# Patient Record
Sex: Female | Born: 1960
Health system: Southern US, Community
[De-identification: ages and names within clinical notes are randomized; demographics above are authoritative.]

## PROBLEM LIST (undated history)

## (undated) DIAGNOSIS — K219 Gastro-esophageal reflux disease without esophagitis: Secondary | ICD-10-CM

## (undated) DIAGNOSIS — M8580 Other specified disorders of bone density and structure, unspecified site: Secondary | ICD-10-CM

## (undated) DIAGNOSIS — N63 Unspecified lump in unspecified breast: Secondary | ICD-10-CM

## (undated) DIAGNOSIS — E785 Hyperlipidemia, unspecified: Secondary | ICD-10-CM

## (undated) DIAGNOSIS — S52502A Unspecified fracture of the lower end of left radius, initial encounter for closed fracture: Secondary | ICD-10-CM

## (undated) DIAGNOSIS — E559 Vitamin D deficiency, unspecified: Secondary | ICD-10-CM

## (undated) HISTORY — DX: Unspecified lump in unspecified breast: N63.0

## (undated) HISTORY — PX: OOPHORECTOMY: SHX86

## (undated) HISTORY — DX: Vitamin D deficiency, unspecified: E55.9

## (undated) HISTORY — PX: BREAST BIOPSY: SHX20

## (undated) HISTORY — DX: Other specified disorders of bone density and structure, unspecified site: M85.80

---

## 1998-04-11 HISTORY — PX: ABDOMINAL HYSTERECTOMY: SHX81

## 2004-08-24 ENCOUNTER — Ambulatory Visit: Payer: Self-pay | Admitting: Family Medicine

## 2007-04-12 HISTORY — PX: TONSILLECTOMY: SUR1361

## 2009-04-11 DIAGNOSIS — N63 Unspecified lump in unspecified breast: Secondary | ICD-10-CM

## 2009-04-11 HISTORY — DX: Unspecified lump in unspecified breast: N63.0

## 2009-04-11 HISTORY — PX: BREAST EXCISIONAL BIOPSY: SUR124

## 2009-06-11 ENCOUNTER — Ambulatory Visit: Payer: Self-pay | Admitting: Family Medicine

## 2009-11-23 ENCOUNTER — Ambulatory Visit: Payer: Self-pay | Admitting: Internal Medicine

## 2011-11-10 ENCOUNTER — Ambulatory Visit: Payer: Self-pay | Admitting: Obstetrics and Gynecology

## 2011-11-10 LAB — CBC WITH DIFFERENTIAL/PLATELET
Basophil %: 0.4 %
Eosinophil %: 1.5 %
HCT: 40.3 % (ref 35.0–47.0)
HGB: 13.9 g/dL (ref 12.0–16.0)
Lymphocyte #: 2.1 10*3/uL (ref 1.0–3.6)
Lymphocyte %: 33.1 %
MCV: 89 fL (ref 80–100)
Monocyte %: 5.5 %
Neutrophil #: 3.9 10*3/uL (ref 1.4–6.5)
Neutrophil %: 59.5 %
Platelet: 291 10*3/uL (ref 150–440)
RBC: 4.54 10*6/uL (ref 3.80–5.20)
RDW: 11.9 % (ref 11.5–14.5)
WBC: 6.5 10*3/uL (ref 3.6–11.0)

## 2011-11-10 LAB — COMPREHENSIVE METABOLIC PANEL
Bilirubin,Total: 0.4 mg/dL (ref 0.2–1.0)
Calcium, Total: 9.3 mg/dL (ref 8.5–10.1)
Chloride: 101 mmol/L (ref 98–107)
Co2: 30 mmol/L (ref 21–32)
EGFR (African American): >60
EGFR (Non-African Amer.): >60
SGOT(AST): 24 U/L (ref 15–37)
SGPT (ALT): 39 U/L
Total Protein: 8.4 g/dL — ABNORMAL HIGH (ref 6.4–8.2)

## 2011-11-10 LAB — TSH: Thyroid Stimulating Horm: 1.26 u[IU]/mL

## 2011-11-10 LAB — LIPID PANEL
Ldl Cholesterol, Calc: 158 mg/dL — ABNORMAL HIGH (ref 0–100)
VLDL Cholesterol, Calc: 43 mg/dL — ABNORMAL HIGH (ref 5–40)

## 2011-11-17 ENCOUNTER — Ambulatory Visit: Payer: Self-pay | Admitting: General Surgery

## 2011-11-28 ENCOUNTER — Ambulatory Visit: Payer: Self-pay | Admitting: General Surgery

## 2012-05-20 ENCOUNTER — Encounter: Payer: Self-pay | Admitting: *Deleted

## 2012-05-20 DIAGNOSIS — N63 Unspecified lump in unspecified breast: Secondary | ICD-10-CM | POA: Insufficient documentation

## 2012-07-10 ENCOUNTER — Ambulatory Visit: Payer: Self-pay | Admitting: General Surgery

## 2012-08-15 ENCOUNTER — Encounter: Payer: Self-pay | Admitting: *Deleted

## 2013-04-22 ENCOUNTER — Ambulatory Visit: Payer: Self-pay

## 2013-04-22 LAB — URINALYSIS, COMPLETE
Glucose,UR: NEGATIVE mg/dL (ref 0–75)
Ketone: NEGATIVE
Nitrite: NEGATIVE
Ph: 6 (ref 4.5–8.0)
Specific Gravity: 1.025 (ref 1.003–1.030)

## 2013-04-24 LAB — URINE CULTURE

## 2014-02-10 ENCOUNTER — Encounter: Payer: Self-pay | Admitting: *Deleted

## 2014-04-02 ENCOUNTER — Ambulatory Visit: Payer: Self-pay | Admitting: Physician Assistant

## 2014-04-02 LAB — URINALYSIS, COMPLETE
Bilirubin,UR: NEGATIVE
Glucose,UR: NEGATIVE
KETONE: NEGATIVE
Nitrite: NEGATIVE
Ph: 5 (ref 5.0–8.0)
Protein: NEGATIVE
Specific Gravity: 1.01 (ref 1.000–1.030)

## 2014-04-04 LAB — URINE CULTURE

## 2015-01-12 LAB — HM PAP SMEAR: HM PAP: NEGATIVE

## 2015-01-23 ENCOUNTER — Other Ambulatory Visit: Payer: Self-pay | Admitting: Obstetrics and Gynecology

## 2015-01-23 ENCOUNTER — Ambulatory Visit
Admission: EM | Admit: 2015-01-23 | Discharge: 2015-01-23 | Disposition: A | Discharge status: Home / Self Care | Payer: 59 | Attending: Internal Medicine | Admitting: Internal Medicine

## 2015-01-23 ENCOUNTER — Encounter: Payer: Self-pay | Admitting: Registered Nurse

## 2015-01-23 DIAGNOSIS — Z13820 Encounter for screening for osteoporosis: Secondary | ICD-10-CM

## 2015-01-23 DIAGNOSIS — L255 Unspecified contact dermatitis due to plants, except food: Secondary | ICD-10-CM

## 2015-01-23 DIAGNOSIS — Z1231 Encounter for screening mammogram for malignant neoplasm of breast: Secondary | ICD-10-CM

## 2015-01-23 MED ORDER — HYDROCORTISONE 2.5 % EX CREA: TOPICAL_CREAM | Freq: Two times a day (BID) | CUTANEOUS | Status: DC

## 2015-01-23 NOTE — ED Notes (Signed)
Pt states "I have rash on my lower legs, I have done Lotrimin and it has not worked. I feel like it's fungus."

## 2015-01-23 NOTE — Discharge Instructions (Signed)
Contact Dermatitis Dermatitis is redness, soreness, and swelling (inflammation) of the skin. Contact dermatitis is a reaction to certain substances that touch the skin. There are two types of contact dermatitis:   Irritant contact dermatitis. This type is caused by something that irritates your skin, such as dry hands from washing them too much. This type does not require previous exposure to the substance for a reaction to occur. This type is more common.  Allergic contact dermatitis. This type is caused by a substance that you are allergic to, such as a nickel allergy or poison ivy. This type only occurs if you have been exposed to the substance (allergen) before. Upon a repeat exposure, your body reacts to the substance. This type is less common. CAUSES  Many different substances can cause contact dermatitis. Irritant contact dermatitis is most commonly caused by exposure to:   Makeup.   Soaps.   Detergents.   Bleaches.   Acids.   Metal salts, such as nickel.  Allergic contact dermatitis is most commonly caused by exposure to:   Poisonous plants.   Chemicals.   Jewelry.   Latex.   Medicines.   Preservatives in products, such as clothing.  RISK FACTORS This condition is more likely to develop in:   People who have jobs that expose them to irritants or allergens.  People who have certain medical conditions, such as asthma or eczema.  SYMPTOMS  Symptoms of this condition may occur anywhere on your body where the irritant has touched you or is touched by you. Symptoms include:  Dryness or flaking.   Redness.   Cracks.   Itching.   Pain or a burning feeling.   Blisters.  Drainage of small amounts of blood or clear fluid from skin cracks. With allergic contact dermatitis, there may also be swelling in areas such as the eyelids, mouth, or genitals.  DIAGNOSIS  This condition is diagnosed with a medical history and physical exam. A patch skin test  may be performed to help determine the cause. If the condition is related to your job, you may need to see an occupational medicine specialist. TREATMENT Treatment for this condition includes figuring out what caused the reaction and protecting your skin from further contact. Treatment may also include:   Steroid creams or ointments. Oral steroid medicines may be needed in more severe cases.  Antibiotics or antibacterial ointments, if a skin infection is present.  Antihistamine lotion or an antihistamine taken by mouth to ease itching.  A bandage (dressing). HOME CARE INSTRUCTIONS Skin Care  Moisturize your skin as needed.   Apply cool compresses to the affected areas.  Try taking a bath with:  Epsom salts. Follow the instructions on the packaging. You can get these at your local pharmacy or grocery store.  Baking soda. Pour a small amount into the bath as directed by your health care provider.  Colloidal oatmeal. Follow the instructions on the packaging. You can get this at your local pharmacy or grocery store.  Try applying baking soda paste to your skin. Stir water into baking soda until it reaches a paste-like consistency.  Do not scratch your skin.  Bathe less frequently, such as every other day.  Bathe in lukewarm water. Avoid using hot water. Medicines  Take or apply over-the-counter and prescription medicines only as told by your health care provider.   If you were prescribed an antibiotic medicine, take or apply your antibiotic as told by your health care provider. Do not stop using the   antibiotic even if your condition starts to improve. General Instructions  Keep all follow-up visits as told by your health care provider. This is important.  Avoid the substance that caused your reaction. If you do not know what caused it, keep a journal to try to track what caused it. Write down:  What you eat.  What cosmetic products you use.  What you drink.  What  you wear in the affected area. This includes jewelry.  If you were given a dressing, take care of it as told by your health care provider. This includes when to change and remove it. SEEK MEDICAL CARE IF:   Your condition does not improve with treatment.  Your condition gets worse.  You have signs of infection such as swelling, tenderness, redness, soreness, or warmth in the affected area.  You have a fever.  You have new symptoms. SEEK IMMEDIATE MEDICAL CARE IF:   You have a severe headache, neck pain, or neck stiffness.  You vomit.  You feel very sleepy.  You notice red streaks coming from the affected area.  Your bone or joint underneath the affected area becomes painful after the skin has healed.  The affected area turns darker.  You have difficulty breathing.   This information is not intended to replace advice given to you by your health care provider. Make sure you discuss any questions you have with your health care provider.   Document Released: 03/25/2000 Document Revised: 12/17/2014 Document Reviewed: 08/13/2014 Elsevier Interactive Patient Education 2016 [employer]  

## 2015-01-23 NOTE — ED Provider Notes (Signed)
CSN: [id number]     Arrival date & time 01/23/15  1043 History   First MD Initiated Contact with Patient 01/23/15 1114     Chief Complaint  Patient presents with  . Rash   (Consider location/radiation/quality/duration/timing/severity/associated sxs/prior Treatment) HPI Comments: [name] works [employer] was weeding garden in shorts  1 month ago developed rash bilateral lower legs.  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  Spreading here for evaluation.  Has had poison ivy in the past this wasn't the same.  Pets at home  Patient is a 54 y.o. female presenting with rash. The history is provided by the patient.  Rash Location:  Leg Leg rash location:  L lower leg and R lower leg Quality: dryness, itchiness, peeling, redness and scaling   Quality: not blistering, not bruising, not burning, not draining, not painful, not swelling and not weeping   Severity:  Mild Onset quality:  Sudden Duration:  1 month Timing:  Constant Progression:  Spreading Chronicity:  New Context: animal contact, medications and plant contact   Context: not chemical exposure, not diapers, not eggs, not exposure to similar rash, not food, not hot tub use, not insect bite/sting, not new detergent/soap, not nuts, not pollen, not pregnancy, not sick contacts and not sun exposure   Relieved by:  Nothing Worsened by:  Nothing tried Ineffective treatments:  Anti-fungal cream Associated symptoms: no abdominal pain, no diarrhea, no fatigue, no fever, no headaches, no hoarse voice, no induration, no joint pain, no myalgias, no nausea, no periorbital edema, no shortness of breath, no sore throat, no throat swelling, no tongue swelling, no URI, not vomiting and not wheezing     Past Medical History  Diagnosis Date  . Lump or mass in breast 2011    LEFT   Past Surgical History  Procedure  Laterality Date  . Tonsillectomy  2009  . Abdominal hysterectomy  2000  . Cesarean section  [id number]  . Breast biopsy Left 2011   History reviewed. No pertinent family history. Social History  Substance Use Topics  . Smoking status: Never Smoker   . Smokeless tobacco: None  . Alcohol Use: No   OB History    Gravida Para Term Preterm AB TAB SAB Ectopic Multiple Living   2 2        2       Obstetric Comments   FIRST PREGNANCY 29 FIRST MENSTRUAL 17     Review of Systems  Constitutional: Negative for fever, chills, diaphoresis, activity change, appetite change, fatigue and unexpected weight change.  HENT: Negative for congestion, dental problem, drooling, ear discharge, ear pain, facial swelling, hoarse voice, mouth sores, sore throat, trouble swallowing and voice change.   Eyes: Negative for photophobia, pain, discharge, redness, itching and visual disturbance.  Respiratory: Negative for cough, choking, chest tightness, shortness of breath, wheezing and stridor.   Cardiovascular: Negative for chest pain, palpitations and leg swelling.  Gastrointestinal: Negative for nausea, vomiting, abdominal pain, diarrhea, constipation, blood in stool, abdominal distention and rectal pain.  Endocrine: Negative for cold intolerance and heat intolerance.  Genitourinary: Negative for dysuria.  Musculoskeletal: Negative for myalgias, back pain, joint swelling, arthralgias, gait problem, neck pain and neck stiffness.  Skin: Positive for rash. Negative for color change, pallor and wound.  Allergic/Immunologic: Positive for environmental allergies. Negative for food allergies.  Neurological: Negative for dizziness, tremors, seizures, syncope, facial asymmetry,  speech difficulty, weakness, light-headedness, numbness and headaches.  Hematological: Negative for adenopathy. Does not bruise/bleed easily.  Psychiatric/Behavioral: Negative for behavioral problems, confusion, sleep disturbance and agitation.     Allergies  Morphine and related  Home Medications   Prior to Admission medications   Medication Sig Start Date End Date Taking? Authorizing Provider  hydrocortisone 2.5 % cream Apply topically 2 (two) times daily. 01/23/15   Barbaraann Barthel, NP   Meds Ordered and Administered this Visit  Medications - No data to display  BP 115/70 mmHg  Pulse 77  Temp(Src) 97.8 F (36.6 C) (Tympanic)  Resp 16  Ht  (1.626 m)  Wt 154 lb (69.854 kg)  BMI 26.42 kg/m2  SpO2 100% No data found.   Physical Exam  Constitutional: She is oriented to person, place, and time. Vital signs are normal. She appears well-developed and well-nourished. No distress.  HENT:  Head: Normocephalic and atraumatic.  Right Ear: External ear normal.  Left Ear: External ear normal.  Mouth/Throat: Oropharynx is clear and moist.  Eyes: Conjunctivae, EOM and lids are normal. Pupils are equal, round, and reactive to light. Right eye exhibits no discharge. Left eye exhibits no discharge. No scleral icterus.  Neck: Trachea normal and normal range of motion. Neck supple. No tracheal deviation present.  Cardiovascular: Normal rate, regular rhythm and intact distal pulses.   Pulmonary/Chest: Effort normal and breath sounds normal. No stridor. No respiratory distress. She has no wheezes. She has no rales. She exhibits no tenderness.  Abdominal: Soft. She exhibits no distension.  Musculoskeletal: Normal range of motion. She exhibits no edema or tenderness.       Right knee: Normal.       Left knee: Normal.       Right ankle: Normal.       Left ankle: Normal.       Right lower leg: Normal.       Left lower leg: Normal.  Neurological: She is alert and oriented to person, place, and time. She displays no atrophy and no tremor. No sensory deficit. She exhibits normal muscle tone. She displays no seizure activity. Coordination and gait normal. GCS eye subscore is 4. GCS verbal subscore is 5. GCS motor subscore is 6.   Skin: Skin is warm, dry and intact. Rash noted. No abrasion, no bruising, no burn, no ecchymosis, no laceration, no lesion, no petechiae and no purpura noted. Rash is macular. Rash is not papular, not maculopapular, not nodular, not pustular, not vesicular and not urticarial. She is not diaphoretic. No cyanosis or erythema. No pallor. Nails show no clubbing.     Nummular scaley erythematous lesions bilateral shins dry fine to medium scale entire lesion not TTP  Psychiatric: She has a normal mood and affect. Her speech is normal and behavior is normal. Judgment and thought content normal. Cognition and memory are normal.  Nursing note and vitals reviewed.   ED Course  Procedures (including critical care time)  Labs Review Labs Reviewed - No data to display  Imaging Review No results found.     MDM   1. Contact dermatitis due to plants, except food    Had been weeding in garden suspect contact dermatitis as rash started after plant contact but no known poison ivy/oak contact.  Trial of lotrimin dried up lesions but spreading anterior shins.  Medication as directed.  Topical hydrocortisone 2.5% cream applied to affected areas then cover with emollient.  Will hold on po prednisone at this time.  If no improvement by Monday afternoon patient to follow up with me 26 Jan 2015.  Avoid itching.   Symptomatic therapy suggested.  Warm to cool water soaks and/or oatmeal baths.  Call or return to clinic as needed if these symptoms worsen or fail to improve as anticipated.  Exitcare handout on contact dermatitis given to patient.  Patient verbalized agreement and understanding of treatment plan.   P2:  Avoidance and hand washing.     Barbaraann Barthel, NP 01/23/15 1704

## 2015-01-29 ENCOUNTER — Other Ambulatory Visit: Payer: Self-pay | Admitting: Obstetrics and Gynecology

## 2015-01-29 ENCOUNTER — Other Ambulatory Visit: Payer: Self-pay | Admitting: *Deleted

## 2015-01-29 ENCOUNTER — Inpatient Hospital Stay
Admission: RE | Admit: 2015-01-29 | Discharge: 2015-01-29 | Disposition: A | Discharge status: Home / Self Care | Payer: Self-pay | Source: Ambulatory Visit | Attending: *Deleted | Admitting: *Deleted

## 2015-01-29 DIAGNOSIS — Z9289 Personal history of other medical treatment: Secondary | ICD-10-CM

## 2015-01-29 DIAGNOSIS — R928 Other abnormal and inconclusive findings on diagnostic imaging of breast: Secondary | ICD-10-CM

## 2015-02-16 ENCOUNTER — Ambulatory Visit
Admission: RE | Admit: 2015-02-16 | Discharge: 2015-02-16 | Disposition: A | Discharge status: Home / Self Care | Payer: 59 | Source: Ambulatory Visit | Attending: Obstetrics and Gynecology | Admitting: Obstetrics and Gynecology

## 2015-02-16 DIAGNOSIS — R928 Other abnormal and inconclusive findings on diagnostic imaging of breast: Secondary | ICD-10-CM | POA: Diagnosis present

## 2015-02-16 DIAGNOSIS — R921 Mammographic calcification found on diagnostic imaging of breast: Secondary | ICD-10-CM | POA: Diagnosis not present

## 2015-02-16 DIAGNOSIS — M8588 Other specified disorders of bone density and structure, other site: Secondary | ICD-10-CM | POA: Insufficient documentation

## 2015-02-16 DIAGNOSIS — M85852 Other specified disorders of bone density and structure, left thigh: Secondary | ICD-10-CM | POA: Diagnosis not present

## 2015-02-16 DIAGNOSIS — Z13820 Encounter for screening for osteoporosis: Secondary | ICD-10-CM | POA: Insufficient documentation

## 2015-07-08 ENCOUNTER — Other Ambulatory Visit: Payer: Self-pay | Admitting: Obstetrics and Gynecology

## 2015-07-08 DIAGNOSIS — N6001 Solitary cyst of right breast: Secondary | ICD-10-CM

## 2015-08-19 ENCOUNTER — Ambulatory Visit
Admission: RE | Admit: 2015-08-19 | Discharge: 2015-08-19 | Disposition: A | Discharge status: Home / Self Care | Payer: 59 | Source: Ambulatory Visit | Attending: Obstetrics and Gynecology | Admitting: Obstetrics and Gynecology

## 2015-08-19 DIAGNOSIS — R928 Other abnormal and inconclusive findings on diagnostic imaging of breast: Secondary | ICD-10-CM | POA: Diagnosis not present

## 2015-08-19 DIAGNOSIS — N6489 Other specified disorders of breast: Secondary | ICD-10-CM | POA: Diagnosis not present

## 2015-08-19 DIAGNOSIS — N6001 Solitary cyst of right breast: Secondary | ICD-10-CM | POA: Insufficient documentation

## 2015-11-19 DIAGNOSIS — H5203 Hypermetropia, bilateral: Secondary | ICD-10-CM | POA: Diagnosis not present

## 2015-12-28 ENCOUNTER — Encounter: Payer: Self-pay | Admitting: Family

## 2015-12-28 ENCOUNTER — Ambulatory Visit (INDEPENDENT_AMBULATORY_CARE_PROVIDER_SITE_OTHER): Payer: 59 | Admitting: Family

## 2015-12-28 VITALS — BP 108/68 | HR 69 | Temp 98.2°F | Ht 64.0 in | Wt 143.0 lb

## 2015-12-28 DIAGNOSIS — N63 Unspecified lump in unspecified breast: Secondary | ICD-10-CM

## 2015-12-28 DIAGNOSIS — Z7189 Other specified counseling: Secondary | ICD-10-CM

## 2015-12-28 DIAGNOSIS — Z7689 Persons encountering health services in other specified circumstances: Secondary | ICD-10-CM

## 2015-12-28 DIAGNOSIS — Z0000 Encounter for general adult medical examination without abnormal findings: Secondary | ICD-10-CM | POA: Insufficient documentation

## 2015-12-28 NOTE — Progress Notes (Signed)
Subjective:    Patient ID: Tammy Chang, female    DOB: 03/26/1961, 55 y.o.   MRN: [id number]  CC: Tammy Chang is a 55 y.o. female who presents today as new patient  HPI: Patient here to establish care as a new patient to our practice.  Breast mass right  08/2015- follow up in 6 months. Has lost weight intentionally recently and suspects that has changed breast density. H/o left breast biopsy which was normal.   Follows with [facility] Pap normal 2016. Dr. Lorre Munroe.    HISTORY:  Past Medical History:  Diagnosis Date  . Lump or mass in breast 2011   LEFT   Past Surgical History:  Procedure Laterality Date  . ABDOMINAL HYSTERECTOMY  2000  . BREAST BIOPSY Left    negative 02/25/2010  . BREAST EXCISIONAL BIOPSY Left 2011   negative 03/29/2010  . CESAREAN SECTION  [id number]  . TONSILLECTOMY  2009   Family History  Problem Relation Age of Onset  . Heart disease Mother   . Hypertension Mother   . Kidney disease Mother   . Alcohol abuse Father   . Diabetes Father   . Breast cancer Maternal Aunt     Allergies: Morphine and related Current Outpatient Prescriptions on File Prior to Visit  Medication Sig Dispense Refill  . hydrocortisone 2.5 % cream Apply topically 2 (two) times daily. 30 g 0   No current facility-administered medications on file prior to visit.     Social History  Substance Use Topics  . Smoking status: Never Smoker  . Smokeless tobacco: Not on file  . Alcohol use No    Review of Systems  Constitutional: Negative for chills and fever.  Respiratory: Negative for cough.   Cardiovascular: Negative for chest pain and palpitations.  Gastrointestinal: Negative for nausea and vomiting.      Objective:    BP 108/68   Pulse 69   Temp 98.2 F (36.8 C) (Oral)   Ht 5\' 4"  (1.626 m)   Wt 143 lb (64.9 kg)   SpO2 99%   BMI 24.55 kg/m  BP Readings from Last 3 Encounters:  12/28/15 108/68  01/23/15 115/70  12/13/11 138/72   Wt Readings  from Last 3 Encounters:  12/28/15 143 lb (64.9 kg)  01/23/15 154 lb (69.9 kg)  12/13/11 159 lb (72.1 kg)    Physical Exam  Constitutional: She appears well-developed and well-nourished.  Eyes: Conjunctivae are normal.  Cardiovascular: Normal rate, regular rhythm, normal heart sounds and normal pulses.   Pulmonary/Chest: Effort normal and breath sounds normal. She has no wheezes. She has no rhonchi. She has no rales.  Neurological: She is alert. She has normal reflexes.  Skin: Skin is warm and dry.  Psychiatric: She has a normal mood and affect. Her speech is normal and behavior is normal. Thought content normal.  Vitals reviewed.      Assessment & Plan:   Problem List Items Addressed This Visit      Other   Lump or mass in breast    Scheduled mammogram for patient.       Encounter to establish care - Primary    No complaints today. Requesting medical records. Patient will establish here for GYN care as well. Reviewed past medical, social and family history with patient. Congratulated patient on lifestyle including healthy diet and daily exercise. She would like to schedule CPE 01/2016 and labs have been ordered today.       Relevant  Orders   CBC with Differential/Platelet   Comprehensive metabolic panel   Hemoglobin A1c   Lipid panel   TSH   VITAMIN D 25 Hydroxy (Vit-D Deficiency, Fractures)   HIV antibody   Hepatitis C antibody    Other Visit Diagnoses    Breast mass in female       Relevant Orders   MM DIGITAL SCREENING BILATERAL   MM DIAG BREAST TOMO BILATERAL   US BREAST LTD UNI RIGHT INC AXILLA       I am having Ms. Harvie maintain her hydrocortisone.   No orders of the defined types were placed in this encounter.   Return precautions given.   Risks, benefits, and alternatives of the medications and treatment plan prescribed today were discussed, and patient expressed understanding.   Education regarding symptom management and diagnosis given to patient  on AVS.  Continue to follow with Rennie PlowmanMargaret Carroll Lingelbach, FNP for routine health maintenance.   Chanda BusingKimberly M Treanor and I agreed with plan.   Rennie PlowmanMargaret Rayan Dyal, FNP

## 2015-12-28 NOTE — Progress Notes (Signed)
Pre visit review using our clinic review tool, if applicable. No additional management support is needed unless otherwise documented below in the visit note. 

## 2015-12-28 NOTE — Assessment & Plan Note (Signed)
Scheduled mammogram for patient.

## 2015-12-28 NOTE — Assessment & Plan Note (Addendum)
No complaints today. Requesting medical records. Patient will establish here for GYN care as well. Reviewed past medical, social and family history with patient. Congratulated patient on lifestyle including healthy diet and daily exercise. She would like to schedule CPE 01/2016 and labs have been ordered today.

## 2015-12-28 NOTE — Patient Instructions (Signed)
Pleasure meeting you. I very much look forward to seeing you for your physical.

## 2016-02-17 ENCOUNTER — Ambulatory Visit
Admission: RE | Admit: 2016-02-17 | Discharge: 2016-02-17 | Disposition: A | Discharge status: Home / Self Care | Payer: 59 | Source: Ambulatory Visit | Attending: Family | Admitting: Family

## 2016-02-17 DIAGNOSIS — N6310 Unspecified lump in the right breast, unspecified quadrant: Secondary | ICD-10-CM | POA: Insufficient documentation

## 2016-02-17 DIAGNOSIS — N63 Unspecified lump in unspecified breast: Secondary | ICD-10-CM

## 2016-02-17 LAB — HM MAMMOGRAPHY

## 2016-02-22 ENCOUNTER — Other Ambulatory Visit
Admission: RE | Admit: 2016-02-22 | Discharge: 2016-02-22 | Disposition: A | Discharge status: Home / Self Care | Payer: 59 | Source: Ambulatory Visit | Attending: Family | Admitting: Family

## 2016-02-22 DIAGNOSIS — Z7689 Persons encountering health services in other specified circumstances: Secondary | ICD-10-CM | POA: Diagnosis not present

## 2016-02-22 LAB — CBC WITH DIFFERENTIAL/PLATELET
BASOS ABS: 0 10*3/uL (ref 0–0.1)
Basophils Relative: 1 %
EOS PCT: 1 %
Eosinophils Absolute: 0.1 10*3/uL (ref 0–0.7)
HCT: 41.4 % (ref 35.0–47.0)
Hemoglobin: 14 g/dL (ref 12.0–16.0)
LYMPHS ABS: 2.3 10*3/uL (ref 1.0–3.6)
LYMPHS PCT: 38 %
MCH: 29.6 pg (ref 26.0–34.0)
MCHC: 33.8 g/dL (ref 32.0–36.0)
MCV: 87.6 fL (ref 80.0–100.0)
MONO ABS: 0.3 10*3/uL (ref 0.2–0.9)
MONOS PCT: 5 %
Neutro Abs: 3.4 10*3/uL (ref 1.4–6.5)
Neutrophils Relative %: 55 %
PLATELETS: 264 10*3/uL (ref 150–440)
RBC: 4.73 MIL/uL (ref 3.80–5.20)
RDW: 12.3 % (ref 11.5–14.5)
WBC: 6 10*3/uL (ref 3.6–11.0)

## 2016-02-22 LAB — LIPID PANEL
CHOL/HDL RATIO: 5.2 ratio
CHOLESTEROL: 233 mg/dL — AB (ref 0–200)
HDL: 45 mg/dL (ref >40)
LDL Cholesterol: 161 mg/dL — ABNORMAL HIGH (ref 0–99)
TRIGLYCERIDES: 136 mg/dL (ref <150)
VLDL: 27 mg/dL (ref 0–40)

## 2016-02-22 LAB — COMPREHENSIVE METABOLIC PANEL
ALT: 25 U/L (ref 14–54)
ANION GAP: 10 (ref 5–15)
AST: 23 U/L (ref 15–41)
Albumin: 4.8 g/dL (ref 3.5–5.0)
Alkaline Phosphatase: 72 U/L (ref 38–126)
BUN: 24 mg/dL — ABNORMAL HIGH (ref 6–20)
CO2: 27 mmol/L (ref 22–32)
Calcium: 9.6 mg/dL (ref 8.9–10.3)
Chloride: 103 mmol/L (ref 101–111)
Creatinine, Ser: 0.71 mg/dL (ref 0.44–1.00)
Glucose, Bld: 99 mg/dL (ref 65–99)
POTASSIUM: 4.3 mmol/L (ref 3.5–5.1)
Sodium: 140 mmol/L (ref 135–145)
TOTAL PROTEIN: 8.3 g/dL — AB (ref 6.5–8.1)
Total Bilirubin: 0.6 mg/dL (ref 0.3–1.2)

## 2016-02-22 LAB — TSH: TSH: 1.806 u[IU]/mL (ref 0.350–4.500)

## 2016-02-22 LAB — RAPID HIV SCREEN (HIV 1/2 AB+AG)
HIV 1/2 Antibodies: NONREACTIVE
HIV-1 P24 Antigen - HIV24: NONREACTIVE

## 2016-02-23 LAB — HEMOGLOBIN A1C
Hgb A1c MFr Bld: 5.7 % — ABNORMAL HIGH (ref 4.8–5.6)
Mean Plasma Glucose: 117 mg/dL

## 2016-02-23 LAB — VITAMIN D 25 HYDROXY (VIT D DEFICIENCY, FRACTURES): Vit D, 25-Hydroxy: 40.6 ng/mL (ref 30.0–100.0)

## 2016-02-23 LAB — HEPATITIS C ANTIBODY

## 2016-02-25 ENCOUNTER — Encounter: Payer: Self-pay | Admitting: Family

## 2016-02-25 ENCOUNTER — Telehealth: Payer: Self-pay

## 2016-02-25 NOTE — Telephone Encounter (Signed)
Looks like A1C done

## 2016-02-25 NOTE — Telephone Encounter (Signed)
Left detailed message about Tammy Chang releasing results via my chart.  If patient still can not see them, she will return call back.

## 2016-02-28 ENCOUNTER — Encounter: Payer: Self-pay | Admitting: Family

## 2016-02-28 NOTE — Progress Notes (Signed)
Subjective:    Patient ID: Tammy Chang, female    DOB: July 28, 1960, 55 y.o.   MRN: [id number]  CC: Tammy BusingKimberly M Chang is a 55 y.o. female who presents today for physical exam.    HPI: Here for CPE.   C/o of sinus congestion, cough,  and hoarseness for one week, worsening. Post nasal drip.Trying mucinex and zyrtec without much relief. No fever, chills, severe HA, vision changes.     Colorectal Cancer Screening: Due  Breast Cancer Screening:  02/17/16. UTD. Cervical Cancer Screening: Follows with [facility] Pap normal 2016. Dr. Lorre MunroeJonas. Bone Health screening/DEXA for 65+: 2016 Dr Lorre MunroeJonas. Osteopenia. On vitamin D and calcium.  Lung Cancer Screening: Doesn't have 30 year pack year history and age > 55 years.   Immunizations       Tetanus - Unsure when last done.       HIV Screening- Done Labs: Screening labs today. Exercise: Gets regular exercise.  Alcohol use: None Smoking/tobacco use: Nonsmoker.  Regular dental exams:UTD Wears seat belt: Yes. Skin: Sees Dr. Cheree DittoGraham.   HISTORY:  Past Medical History:  Diagnosis Date  . Lump or mass in breast 2011   LEFT    Past Surgical History:  Procedure Laterality Date  . ABDOMINAL HYSTERECTOMY  2000  . BREAST BIOPSY Left    negative 02/25/2010  . BREAST EXCISIONAL BIOPSY Left 2011   negative 03/29/2010  . CESAREAN SECTION  [id number]  . TONSILLECTOMY  2009   Family History  Problem Relation Age of Onset  . Heart disease Mother   . Hypertension Mother   . Kidney disease Mother   . Alcohol abuse Father   . Diabetes Father   . Breast cancer Maternal Aunt       ALLERGIES: Morphine and related  Current Outpatient Prescriptions on File Prior to Visit  Medication Sig Dispense Refill  . hydrocortisone 2.5 % cream Apply topically 2 (two) times daily. 30 g 0   No current facility-administered medications on file prior to visit.     Social History  Substance Use Topics  . Smoking status: Never Smoker  . Smokeless  tobacco: Never Used  . Alcohol use No    Review of Systems  Constitutional: Negative for chills, fever and unexpected weight change.  HENT: Positive for postnasal drip. Negative for congestion.   Respiratory: Positive for cough.   Cardiovascular: Negative for chest pain, palpitations and leg swelling.  Gastrointestinal: Negative for nausea and vomiting.  Musculoskeletal: Negative for arthralgias and myalgias.  Skin: Negative for rash.  Neurological: Negative for headaches.  Hematological: Negative for adenopathy.  Psychiatric/Behavioral: Negative for confusion.      Objective:    BP 108/62   Pulse 77   Temp 98.2 F (36.8 C) (Oral)   Ht 5\' 4"  (1.626 m)   Wt 143 lb 3.2 oz (65 kg)   SpO2 98%   BMI 24.58 kg/m   BP Readings from Last 3 Encounters:  02/29/16 108/62  12/28/15 108/68  01/23/15 115/70   Wt Readings from Last 3 Encounters:  02/29/16 143 lb 3.2 oz (65 kg)  12/28/15 143 lb (64.9 kg)  01/23/15 154 lb (69.9 kg)    Physical Exam  Constitutional: She appears well-developed and well-nourished.  HENT:  Head: Normocephalic and atraumatic.  Right Ear: Hearing, tympanic membrane, external ear and ear canal normal. No drainage, swelling or tenderness. No foreign bodies. Tympanic membrane is not erythematous and not bulging. No middle ear effusion. No decreased hearing is  noted.  Left Ear: Hearing, tympanic membrane, external ear and ear canal normal. No drainage, swelling or tenderness. No foreign bodies. Tympanic membrane is not erythematous and not bulging.  No middle ear effusion. No decreased hearing is noted.  Nose: Rhinorrhea present. Right sinus exhibits no maxillary sinus tenderness and no frontal sinus tenderness. Left sinus exhibits no maxillary sinus tenderness and no frontal sinus tenderness.  Mouth/Throat: Uvula is midline, oropharynx is clear and moist and mucous membranes are normal. No oropharyngeal exudate, posterior oropharyngeal edema, posterior  oropharyngeal erythema or tonsillar abscesses.  Eyes: Conjunctivae are normal.  Neck: No thyroid mass and no thyromegaly present.  Cardiovascular: Normal rate, regular rhythm, normal heart sounds and normal pulses.   Pulmonary/Chest: Effort normal and breath sounds normal. She has no wheezes. She has no rhonchi. She has no rales.  Lymphadenopathy:       Head (right side): No submental, no submandibular, no tonsillar, no preauricular, no posterior auricular and no occipital adenopathy present.       Head (left side): No submental, no submandibular, no tonsillar, no preauricular, no posterior auricular and no occipital adenopathy present.    She has no cervical adenopathy.  Neurological: She is alert.  Skin: Skin is warm and dry.  Psychiatric: She has a normal mood and affect. Her speech is normal and behavior is normal. Thought content normal.  Vitals reviewed.      Assessment & Plan:   Problem List Items Addressed This Visit      Respiratory   Sinus congestion    Suspect viral versus allergic etiology. Afebrile. Advised patient to wait 1-2 more days of symptom management prior to filling antibiotic. Return precautions given.      Relevant Medications   amoxicillin (AMOXIL) 500 MG capsule     Other   Routine physical examination - Primary    Patient is due for colonoscopy, referral placed. Patient had her mammogram 2 weeks ago and ultrasound. She politely declines breast exam today since she recently had a mammogram and has no breast changes. Up-to-date on her Pap smear, 2016. Declines pelvic exam in the absence of symptoms today. She also had a DEXA scan last year with her prior GYN which showed osteopenia. She is currently on vitamin D and calcium. We plan to recheck DEXA 2018. Nonsmoker. Advised her to get her tetanus done at her pharmacy due to cost. Encouraged regular exercise. Screening labs are to be done. Patient will follow-up with dermatologist, Dr. Cheree DittoGraham, for annual skin  check.      Relevant Medications   Tdap (BOOSTRIX) 5-2.5-18.5 LF-MCG/0.5 injection   Other Relevant Orders   Ambulatory referral to Gastroenterology       I am having Ms. Bohne start on Tdap and amoxicillin. I am also having her maintain her hydrocortisone.   Meds ordered this encounter  Medications  . Tdap (BOOSTRIX) 5-2.5-18.5 LF-MCG/0.5 injection    Sig: Inject 0.5 mLs into the muscle once.    Dispense:  0.5 mL    Refill:  0    Order Specific Question:   Supervising Provider    Answer:   Duncan DullULLO, TERESA L [[id number]]  . amoxicillin (AMOXIL) 500 MG capsule    Sig: Take 1 capsule (500 mg total) by mouth 2 (two) times daily.    Dispense:  14 capsule    Refill:  0    Order Specific Question:   Supervising Provider    Answer:   Sherlene ShamsULLO, TERESA L [[id number]]    Return precautions given.  Risks, benefits, and alternatives of the medications and treatment plan prescribed today were discussed, and patient expressed understanding.   Education regarding symptom management and diagnosis given to patient on AVS.   Continue to follow with Rennie Plowman, FNP for routine health maintenance.   Tammy Busing and I agreed with plan.   Rennie Plowman, FNP

## 2016-02-29 ENCOUNTER — Ambulatory Visit (INDEPENDENT_AMBULATORY_CARE_PROVIDER_SITE_OTHER): Payer: 59 | Admitting: Family

## 2016-02-29 ENCOUNTER — Encounter: Payer: Self-pay | Admitting: Family

## 2016-02-29 VITALS — BP 108/62 | HR 77 | Temp 98.2°F | Ht 64.0 in | Wt 143.2 lb

## 2016-02-29 DIAGNOSIS — R0981 Nasal congestion: Secondary | ICD-10-CM | POA: Diagnosis not present

## 2016-02-29 DIAGNOSIS — Z0000 Encounter for general adult medical examination without abnormal findings: Secondary | ICD-10-CM

## 2016-02-29 DIAGNOSIS — Z0001 Encounter for general adult medical examination with abnormal findings: Secondary | ICD-10-CM

## 2016-02-29 MED ORDER — TETANUS-DIPHTH-ACELL PERTUSSIS 5-2.5-18.5 LF-MCG/0.5 IM SUSP: 0.5000 mL | Freq: Once | INTRAMUSCULAR | 0 refills | Status: AC

## 2016-02-29 MED ORDER — AMOXICILLIN 500 MG PO CAPS: 500.0000 mg | ORAL_CAPSULE | Freq: Two times a day (BID) | ORAL | 0 refills | Status: DC

## 2016-02-29 NOTE — Assessment & Plan Note (Signed)
Suspect viral versus allergic etiology. Afebrile. Advised patient to wait 1-2 more days of symptom management prior to filling antibiotic. Return precautions given.

## 2016-02-29 NOTE — Assessment & Plan Note (Signed)
Patient is due for colonoscopy, referral placed. Patient had her mammogram 2 weeks ago and ultrasound. She politely declines breast exam today since she recently had a mammogram and has no breast changes. Up-to-date on her Pap smear, 2016. Declines pelvic exam in the absence of symptoms today. She also had a DEXA scan last year with her prior GYN which showed osteopenia. She is currently on vitamin D and calcium. We plan to recheck DEXA 2018. Nonsmoker. Advised her to get her tetanus done at her pharmacy due to cost. Encouraged regular exercise. Screening labs are to be done. Patient will follow-up with dermatologist, Dr. Cheree DittoGraham, for annual skin check.

## 2016-02-29 NOTE — Progress Notes (Signed)
Pre visit review using our clinic review tool, if applicable. No additional management support is needed unless otherwise documented below in the visit note. 

## 2016-02-29 NOTE — Patient Instructions (Signed)
Pleasure seeing you  Health Maintenance, Female Introduction Adopting a healthy lifestyle and getting preventive care can go a long way to promote health and wellness. Talk with your health care provider about what schedule of regular examinations is right for you. This is a good chance for you to check in with your provider about disease prevention and staying healthy. In between checkups, there are plenty of things you can do on your own. Experts have done a lot of research about which lifestyle changes and preventive measures are most likely to keep you healthy. Ask your health care provider for more information. Weight and diet Eat a healthy diet  Be sure to include plenty of vegetables, fruits, low-fat dairy products, and lean protein.  Do not eat a lot of foods high in solid fats, added sugars, or salt.  Get regular exercise. This is one of the most important things you can do for your health.  Most adults should exercise for at least 150 minutes each week. The exercise should increase your heart rate and make you sweat (moderate-intensity exercise).  Most adults should also do strengthening exercises at least twice a week. This is in addition to the moderate-intensity exercise. Maintain a healthy weight  Body mass index (BMI) is a measurement that can be used to identify possible weight problems. It estimates body fat based on height and weight. Your health care provider can help determine your BMI and help you achieve or maintain a healthy weight.  For females 20 years of age and older:  A BMI below 18.5 is considered underweight.  A BMI of 18.5 to 24.9 is normal.  A BMI of 25 to 29.9 is considered overweight.  A BMI of 30 and above is considered obese. Watch levels of cholesterol and blood lipids  You should start having your blood tested for lipids and cholesterol at 53 years of age, then have this test every 5 years.  You may need to have your cholesterol levels checked  more often if:  Your lipid or cholesterol levels are high.  You are older than 53 years of age.  You are at high risk for heart disease. Cancer screening Lung Cancer  Lung cancer screening is recommended for adults 55-80 years old who are at high risk for lung cancer because of a history of smoking.  A yearly low-dose CT scan of the lungs is recommended for people who:  Currently smoke.  Have quit within the past 15 years.  Have at least a 30-pack-year history of smoking. A pack year is smoking an average of one pack of cigarettes a day for 1 year.  Yearly screening should continue until it has been 15 years since you quit.  Yearly screening should stop if you develop a health problem that would prevent you from having lung cancer treatment. Breast Cancer  Practice breast self-awareness. This means understanding how your breasts normally appear and feel.  It also means doing regular breast self-exams. Let your health care provider know about any changes, no matter how small.  If you are in your 20s or 30s, you should have a clinical breast exam (CBE) by a health care provider every 1-3 years as part of a regular health exam.  If you are 40 or older, have a CBE every year. Also consider having a breast X-ray (mammogram) every year.  If you have a family history of breast cancer, talk to your health care provider about genetic screening.  If you are at high   risk for breast cancer, talk to your health care provider about having an MRI and a mammogram every year.  Breast cancer gene (BRCA) assessment is recommended for women who have family members with BRCA-related cancers. BRCA-related cancers include:  Breast.  Ovarian.  Tubal.  Peritoneal cancers.  Results of the assessment will determine the need for genetic counseling and BRCA1 and BRCA2 testing. Cervical Cancer  Your health care provider may recommend that you be screened regularly for cancer of the pelvic organs  (ovaries, uterus, and vagina). This screening involves a pelvic examination, including checking for microscopic changes to the surface of your cervix (Pap test). You may be encouraged to have this screening done every 3 years, beginning at age 21.  For women ages 30-65, health care providers may recommend pelvic exams and Pap testing every 3 years, or they may recommend the Pap and pelvic exam, combined with testing for human papilloma virus (HPV), every 5 years. Some types of HPV increase your risk of cervical cancer. Testing for HPV may also be done on women of any age with unclear Pap test results.  Other health care providers may not recommend any screening for nonpregnant women who are considered low risk for pelvic cancer and who do not have symptoms. Ask your health care provider if a screening pelvic exam is right for you.  If you have had past treatment for cervical cancer or a condition that could lead to cancer, you need Pap tests and screening for cancer for at least 20 years after your treatment. If Pap tests have been discontinued, your risk factors (such as having a new sexual partner) need to be reassessed to determine if screening should resume. Some women have medical problems that increase the chance of getting cervical cancer. In these cases, your health care provider may recommend more frequent screening and Pap tests. Colorectal Cancer  This type of cancer can be detected and often prevented.  Routine colorectal cancer screening usually begins at 53 years of age and continues through 53 years of age.  Your health care provider may recommend screening at an earlier age if you have risk factors for colon cancer.  Your health care provider may also recommend using home test kits to check for hidden blood in the stool.  A small camera at the end of a tube can be used to examine your colon directly (sigmoidoscopy or colonoscopy). This is done to check for the earliest forms of  colorectal cancer.  Routine screening usually begins at age 50.  Direct examination of the colon should be repeated every 5-10 years through 53 years of age. However, you may need to be screened more often if early forms of precancerous polyps or small growths are found. Skin Cancer  Check your skin from head to toe regularly.  Tell your health care provider about any new moles or changes in moles, especially if there is a change in a mole's shape or color.  Also tell your health care provider if you have a mole that is larger than the size of a pencil eraser.  Always use sunscreen. Apply sunscreen liberally and repeatedly throughout the day.  Protect yourself by wearing long sleeves, pants, a wide-brimmed hat, and sunglasses whenever you are outside. Heart disease, diabetes, and high blood pressure  High blood pressure causes heart disease and increases the risk of stroke. High blood pressure is more likely to develop in:  People who have blood pressure in the high end of the normal   range (130-139/85-89 mm Hg).  People who are overweight or obese.  People who are African American.  If you are 18-39 years of age, have your blood pressure checked every 3-5 years. If you are 40 years of age or older, have your blood pressure checked every year. You should have your blood pressure measured twice-once when you are at a hospital or clinic, and once when you are not at a hospital or clinic. Record the average of the two measurements. To check your blood pressure when you are not at a hospital or clinic, you can use:  An automated blood pressure machine at a pharmacy.  A home blood pressure monitor.  If you are between 55 years and 79 years old, ask your health care provider if you should take aspirin to prevent strokes.  Have regular diabetes screenings. This involves taking a blood sample to check your fasting blood sugar level.  If you are at a normal weight and have a low risk for  diabetes, have this test once every three years after 53 years of age.  If you are overweight and have a high risk for diabetes, consider being tested at a younger age or more often. Preventing infection Hepatitis B  If you have a higher risk for hepatitis B, you should be screened for this virus. You are considered at high risk for hepatitis B if:  You were born in a country where hepatitis B is common. Ask your health care provider which countries are considered high risk.  Your parents were born in a high-risk country, and you have not been immunized against hepatitis B (hepatitis B vaccine).  You have HIV or AIDS.  You use needles to inject street drugs.  You live with someone who has hepatitis B.  You have had sex with someone who has hepatitis B.  You get hemodialysis treatment.  You take certain medicines for conditions, including cancer, organ transplantation, and autoimmune conditions. Hepatitis C  Blood testing is recommended for:  Everyone born from 1945 through 1965.  Anyone with known risk factors for hepatitis C. Sexually transmitted infections (STIs)  You should be screened for sexually transmitted infections (STIs) including gonorrhea and chlamydia if:  You are sexually active and are younger than 53 years of age.  You are older than 53 years of age and your health care provider tells you that you are at risk for this type of infection.  Your sexual activity has changed since you were last screened and you are at an increased risk for chlamydia or gonorrhea. Ask your health care provider if you are at risk.  If you do not have HIV, but are at risk, it may be recommended that you take a prescription medicine daily to prevent HIV infection. This is called pre-exposure prophylaxis (PrEP). You are considered at risk if:  You are sexually active and do not regularly use condoms or know the HIV status of your partner(s).  You take drugs by injection.  You are  sexually active with a partner who has HIV. Talk with your health care provider about whether you are at high risk of being infected with HIV. If you choose to begin PrEP, you should first be tested for HIV. You should then be tested every 3 months for as long as you are taking PrEP. Pregnancy  If you are premenopausal and you may become pregnant, ask your health care provider about preconception counseling.  If you may become pregnant, take 400 to 800   micrograms (mcg) of folic acid every day.  If you want to prevent pregnancy, talk to your health care provider about birth control (contraception). Osteoporosis and menopause  Osteoporosis is a disease in which the bones lose minerals and strength with aging. This can result in serious bone fractures. Your risk for osteoporosis can be identified using a bone density scan.  If you are 65 years of age or older, or if you are at risk for osteoporosis and fractures, ask your health care provider if you should be screened.  Ask your health care provider whether you should take a calcium or vitamin D supplement to lower your risk for osteoporosis.  Menopause may have certain physical symptoms and risks.  Hormone replacement therapy may reduce some of these symptoms and risks. Talk to your health care provider about whether hormone replacement therapy is right for you. Follow these instructions at home:  Schedule regular health, dental, and eye exams.  Stay current with your immunizations.  Do not use any tobacco products including cigarettes, chewing tobacco, or electronic cigarettes.  If you are pregnant, do not drink alcohol.  If you are breastfeeding, limit how much and how often you drink alcohol.  Limit alcohol intake to no more than 1 drink per day for nonpregnant women. One drink equals 12 ounces of beer, 5 ounces of wine, or 1 ounces of hard liquor.  Do not use street drugs.  Do not share needles.  Ask your health care  provider for help if you need support or information about quitting drugs.  Tell your health care provider if you often feel depressed.  Tell your health care provider if you have ever been abused or do not feel safe at home. This information is not intended to replace advice given to you by your health care provider. Make sure you discuss any questions you have with your health care provider. Document Released: 10/11/2010 Document Revised: 09/03/2015 Document Reviewed: 12/30/2014  2017 [employer]vier  

## 2016-03-31 ENCOUNTER — Encounter: Payer: Self-pay | Admitting: Family

## 2016-05-18 ENCOUNTER — Other Ambulatory Visit: Payer: Self-pay | Admitting: Obstetrics and Gynecology

## 2016-05-18 DIAGNOSIS — N63 Unspecified lump in unspecified breast: Secondary | ICD-10-CM

## 2016-10-26 ENCOUNTER — Encounter: Payer: Self-pay | Admitting: Physician Assistant

## 2016-10-26 ENCOUNTER — Ambulatory Visit: Payer: Self-pay | Admitting: Physician Assistant

## 2016-10-26 VITALS — BP 90/70 | HR 61 | Temp 98.1°F

## 2016-10-26 DIAGNOSIS — R0981 Nasal congestion: Secondary | ICD-10-CM

## 2016-10-26 DIAGNOSIS — J0100 Acute maxillary sinusitis, unspecified: Secondary | ICD-10-CM

## 2016-10-26 MED ORDER — AZITHROMYCIN 250 MG PO TABS: ORAL_TABLET | ORAL | 0 refills | Status: DC

## 2016-10-26 NOTE — Progress Notes (Signed)
S: C/o runny nose and congestion for 3 days, no fever, chills, cp/sob, v/d; mucus is  thick, cough is sporadic, c/o of facial and dental pain. Drainage and cough are worse at night, going out of town this weekend  Using otc meds:   O: PE: vitals wnl, nad, perrl eomi, normocephalic, tms dull, nasal mucosa red and swollen, throat injected, neck supple no lymph, lungs c t a, cv rrr, neuro intact  A:  Acute sinusitis   P: drink fluids, continue regular meds , use otc meds of choice, return if not improving in 5 days, return earlier if worsening , zpack for travel, try otc meds prior to using

## 2016-12-23 ENCOUNTER — Telehealth: Payer: Self-pay | Admitting: *Deleted

## 2016-12-23 DIAGNOSIS — N6310 Unspecified lump in the right breast, unspecified quadrant: Secondary | ICD-10-CM

## 2016-12-23 DIAGNOSIS — Z0000 Encounter for general adult medical examination without abnormal findings: Secondary | ICD-10-CM

## 2016-12-23 NOTE — Telephone Encounter (Signed)
Please advise 

## 2016-12-23 NOTE — Telephone Encounter (Signed)
Pt has requested to have a oreder for her mammogram and lbs to place prior to her physical in November.  Pt contact [phone]

## 2016-12-26 NOTE — Telephone Encounter (Signed)
Patient has been informed.

## 2016-12-26 NOTE — Telephone Encounter (Signed)
Left message for patient to return call back.  

## 2016-12-26 NOTE — Telephone Encounter (Signed)
Call patient I have ordered screening mammogram as well as right breast ultrasound as it does appear she had a right cyst noted in November 2017. This requires 2 years with an ultrasound and as long as stable then we can resume normal mammogram.   Please let me know if the patient has a different interpretation/understanding of this.  Also included fasting labs. She is a candidate for 1 time screen for HIV of which I did not included at this time. If she would like this lab included, please order for me

## 2016-12-30 ENCOUNTER — Telehealth: Payer: Self-pay

## 2016-12-30 DIAGNOSIS — N63 Unspecified lump in unspecified breast: Secondary | ICD-10-CM

## 2016-12-30 NOTE — Telephone Encounter (Signed)
Order has been reoredered per request

## 2017-01-03 ENCOUNTER — Encounter: Payer: Self-pay | Admitting: Family

## 2017-02-20 ENCOUNTER — Other Ambulatory Visit: Payer: Self-pay

## 2017-02-21 ENCOUNTER — Ambulatory Visit
Admission: RE | Admit: 2017-02-21 | Discharge: 2017-02-21 | Disposition: A | Discharge status: Home / Self Care | Payer: 59 | Source: Ambulatory Visit | Attending: Family | Admitting: Family

## 2017-02-21 DIAGNOSIS — N6310 Unspecified lump in the right breast, unspecified quadrant: Secondary | ICD-10-CM

## 2017-02-21 DIAGNOSIS — N63 Unspecified lump in unspecified breast: Secondary | ICD-10-CM

## 2017-02-21 DIAGNOSIS — N6313 Unspecified lump in the right breast, lower outer quadrant: Secondary | ICD-10-CM | POA: Diagnosis not present

## 2017-02-21 DIAGNOSIS — N6489 Other specified disorders of breast: Secondary | ICD-10-CM | POA: Diagnosis not present

## 2017-02-21 DIAGNOSIS — R928 Other abnormal and inconclusive findings on diagnostic imaging of breast: Secondary | ICD-10-CM | POA: Diagnosis not present

## 2017-03-01 ENCOUNTER — Encounter: Payer: Self-pay | Admitting: Family

## 2017-03-07 ENCOUNTER — Encounter: Payer: Self-pay | Admitting: Internal Medicine

## 2017-03-07 ENCOUNTER — Other Ambulatory Visit
Admission: RE | Admit: 2017-03-07 | Discharge: 2017-03-07 | Disposition: A | Discharge status: Home / Self Care | Payer: 59 | Source: Ambulatory Visit | Attending: Family | Admitting: Family

## 2017-03-07 DIAGNOSIS — Z0000 Encounter for general adult medical examination without abnormal findings: Secondary | ICD-10-CM | POA: Insufficient documentation

## 2017-03-07 LAB — CBC WITH DIFFERENTIAL/PLATELET
BASOS ABS: 0 10*3/uL (ref 0–0.1)
BASOS PCT: 1 %
Eosinophils Absolute: 0.1 10*3/uL (ref 0–0.7)
Eosinophils Relative: 2 %
HEMATOCRIT: 40.7 % (ref 35.0–47.0)
HEMOGLOBIN: 13.6 g/dL (ref 12.0–16.0)
LYMPHS PCT: 37 %
Lymphs Abs: 2.3 10*3/uL (ref 1.0–3.6)
MCH: 29.7 pg (ref 26.0–34.0)
MCHC: 33.5 g/dL (ref 32.0–36.0)
MCV: 88.7 fL (ref 80.0–100.0)
Monocytes Absolute: 0.3 10*3/uL (ref 0.2–0.9)
Monocytes Relative: 5 %
NEUTROS ABS: 3.5 10*3/uL (ref 1.4–6.5)
NEUTROS PCT: 55 %
Platelets: 272 10*3/uL (ref 150–440)
RBC: 4.58 MIL/uL (ref 3.80–5.20)
RDW: 12 % (ref 11.5–14.5)
WBC: 6.3 10*3/uL (ref 3.6–11.0)

## 2017-03-07 LAB — LIPID PANEL
CHOL/HDL RATIO: 4.8 ratio
CHOLESTEROL: 225 mg/dL — AB (ref 0–200)
HDL: 47 mg/dL (ref >40)
LDL Cholesterol: 157 mg/dL — ABNORMAL HIGH (ref 0–99)
TRIGLYCERIDES: 103 mg/dL (ref <150)
VLDL: 21 mg/dL (ref 0–40)

## 2017-03-07 LAB — COMPREHENSIVE METABOLIC PANEL
ALK PHOS: 78 U/L (ref 38–126)
ALT: 19 U/L (ref 14–54)
ANION GAP: 11 (ref 5–15)
AST: 21 U/L (ref 15–41)
Albumin: 4.8 g/dL (ref 3.5–5.0)
BUN: 23 mg/dL — ABNORMAL HIGH (ref 6–20)
CALCIUM: 9.2 mg/dL (ref 8.9–10.3)
CO2: 23 mmol/L (ref 22–32)
Chloride: 103 mmol/L (ref 101–111)
Creatinine, Ser: 0.78 mg/dL (ref 0.44–1.00)
GFR calc non Af Amer: >60 mL/min (ref >60)
Glucose, Bld: 84 mg/dL (ref 65–99)
POTASSIUM: 3.6 mmol/L (ref 3.5–5.1)
Sodium: 137 mmol/L (ref 135–145)
TOTAL PROTEIN: 8.4 g/dL — AB (ref 6.5–8.1)
Total Bilirubin: 0.5 mg/dL (ref 0.3–1.2)

## 2017-03-07 LAB — TSH: TSH: 1.334 u[IU]/mL (ref 0.350–4.500)

## 2017-03-07 LAB — HEMOGLOBIN A1C
Hgb A1c MFr Bld: 5.7 % — ABNORMAL HIGH (ref 4.8–5.6)
MEAN PLASMA GLUCOSE: 116.89 mg/dL

## 2017-03-08 ENCOUNTER — Telehealth: Payer: Self-pay | Admitting: Family

## 2017-03-08 LAB — VITAMIN D 25 HYDROXY (VIT D DEFICIENCY, FRACTURES): Vit D, 25-Hydroxy: 28.8 ng/mL — ABNORMAL LOW (ref 30.0–100.0)

## 2017-03-08 NOTE — Telephone Encounter (Signed)
Vitamin D results given to patient.

## 2017-03-13 ENCOUNTER — Encounter: Payer: Self-pay | Admitting: Internal Medicine

## 2017-03-13 ENCOUNTER — Ambulatory Visit (INDEPENDENT_AMBULATORY_CARE_PROVIDER_SITE_OTHER): Payer: 59 | Admitting: Internal Medicine

## 2017-03-13 VITALS — BP 98/70 | HR 82 | Temp 98.1°F | Resp 12 | Ht 63.5 in | Wt 147.2 lb

## 2017-03-13 DIAGNOSIS — R778 Other specified abnormalities of plasma proteins: Secondary | ICD-10-CM | POA: Diagnosis not present

## 2017-03-13 DIAGNOSIS — R799 Abnormal finding of blood chemistry, unspecified: Secondary | ICD-10-CM

## 2017-03-13 DIAGNOSIS — Z0000 Encounter for general adult medical examination without abnormal findings: Secondary | ICD-10-CM

## 2017-03-13 DIAGNOSIS — E559 Vitamin D deficiency, unspecified: Secondary | ICD-10-CM | POA: Diagnosis not present

## 2017-03-13 LAB — URINALYSIS, ROUTINE W REFLEX MICROSCOPIC
Bilirubin Urine: NEGATIVE
HGB URINE DIPSTICK: NEGATIVE
Ketones, ur: NEGATIVE
NITRITE: NEGATIVE
Specific Gravity, Urine: 1.015 (ref 1.000–1.030)
TOTAL PROTEIN, URINE-UPE24: NEGATIVE
URINE GLUCOSE: NEGATIVE
UROBILINOGEN UA: 0.2 (ref 0.0–1.0)
pH: 5.5 (ref 5.0–8.0)

## 2017-03-13 NOTE — Patient Instructions (Addendum)
Please follow up in 4-6 months  Think about colonoscopy with Dr. Charna ElizabethJyothi Mann  rec take vitamin D 5000 IU daily and calcium 600 mg 2x per day  Read below about cholesterol  Think about Tdap and shingrix vaccines  Repeat bone density due 02/2018  Stop Ibuprofen max dose of tylenol is 3000 mg total per day (if 500 mg 6 pills per day)     Cholesterol Cholesterol is a white, waxy, fat-like substance that is needed by the human body in small amounts. The liver makes all the cholesterol we need. Cholesterol is carried from the liver by the blood through the blood vessels. Deposits of cholesterol (plaques) may build up on blood vessel (artery) walls. Plaques make the arteries narrower and stiffer. Cholesterol plaques increase the risk for heart attack and stroke. You cannot feel your cholesterol level even if it is very high. The only way to know that it is high is to have a blood test. Once you know your cholesterol levels, you should keep a record of the test results. Work with your health care provider to keep your levels in the desired range. What do the results mean?  Total cholesterol is a rough measure of all the cholesterol in your blood.  LDL (low-density lipoprotein) is the "bad" cholesterol. This is the type that causes plaque to build up on the artery walls. You want this level to be low.  HDL (high-density lipoprotein) is the "good" cholesterol because it cleans the arteries and carries the LDL away. You want this level to be high.  Triglycerides are fat that the body can either burn for energy or store. High levels are closely linked to heart disease. What are the desired levels of cholesterol?  Total cholesterol below 200.  LDL below 100 for people who are at risk, below 70 for people at very high risk.  HDL above 40 is good. A level of 60 or higher is considered to be protective against heart disease.  Triglycerides below 150. How can I lower my cholesterol? Diet Follow your  diet program as told by your health care provider.  Choose fish or white meat chicken and [place], roasted or baked. Limit fatty cuts of red meat, fried foods, and processed meats, such as sausage and lunch meats.  Eat lots of fresh fruits and vegetables.  Choose whole grains, beans, pasta, potatoes, and cereals.  Choose olive oil, corn oil, or canola oil, and use only small amounts.  Avoid butter, mayonnaise, shortening, or palm kernel oils.  Avoid foods with trans fats.  Drink skim or nonfat milk and eat low-fat or nonfat yogurt and cheeses. Avoid whole milk, cream, ice cream, egg yolks, and full-fat cheeses.  Healthier desserts include angel food cake, ginger snaps, animal crackers, hard candy, popsicles, and low-fat or nonfat frozen yogurt. Avoid pastries, cakes, pies, and cookies.  Exercise  Follow your exercise program as told by your health care provider. A regular program: ? Helps to decrease LDL and raise HDL. ? Helps with weight control.  Do things that increase your activity level, such as gardening, walking, and taking the stairs.  Ask your health care provider about ways that you can be more active in your daily life.  Medicine  Take over-the-counter and prescription medicines only as told by your health care provider. ? Medicine may be prescribed by your health care provider to help lower cholesterol and decrease the risk for heart disease. This is usually done if diet and exercise have failed  to bring down cholesterol levels. ? If you have several risk factors, you may need medicine even if your levels are normal.  This information is not intended to replace advice given to you by your health care provider. Make sure you discuss any questions you have with your health care provider. Document Released: 12/21/2000 Document Revised: 10/24/2015 Document Reviewed: 09/26/2015 Elsevier Interactive Patient Education  2017 [employer]

## 2017-03-13 NOTE — Progress Notes (Addendum)
Chief Complaint  Patient presents with  . Annual Exam   Annual exam doing well no complaints. Reviewed labs BUN 23, TP 8.4, TC 225, LDL 157, vit D 28.8, A1c 5.7.  HLD pt reports she is working out on the treadmill for 25 minutes at times. She also modified diet    Review of Systems  Constitutional: Negative for weight loss.  HENT: Negative for hearing loss.   Eyes:       No vision problems   Respiratory: Negative for shortness of breath.   Cardiovascular: Negative for chest pain.  Gastrointestinal: Negative for abdominal pain.  Musculoskeletal: Negative for joint pain.       +feet legs hurt at the end of the day  Skin: Negative for rash.  Neurological: Negative for headaches.  Endo/Heme/Allergies:       Denies hot flashes   Psychiatric/Behavioral: Negative for depression.   Past Medical History:  Diagnosis Date  . Lump or mass in breast 2011   LEFT  . Vitamin D deficiency    Past Surgical History:  Procedure Laterality Date  . ABDOMINAL HYSTERECTOMY  2000  . BREAST BIOPSY Left    negative 02/25/2010  . BREAST EXCISIONAL BIOPSY Left 2011   negative 03/29/2010  . CESAREAN SECTION  [id number]  . TONSILLECTOMY  2009   Family History  Problem Relation Age of Onset  . Heart disease Mother   . Hypertension Mother   . Kidney disease Mother   . Alcohol abuse Father   . Diabetes Father   . Breast cancer Maternal Aunt    Social History   Socioeconomic History  . Marital status: Married    Spouse name: Not on file  . Number of children: Not on file  . Years of education: Not on file  . Highest education level: Not on file  Social Needs  . Financial resource strain: Not on file  . Food insecurity - worry: Not on file  . Food insecurity - inability: Not on file  . Transportation needs - medical: Not on file  . Transportation needs - non-medical: Not on file  Occupational History  . Not on file  Tobacco Use  . Smoking status: Never Smoker  . Smokeless tobacco: Never  Used  Substance and Sexual Activity  . Alcohol use: No  . Drug use: No  . Sexual activity: Not on file  Other Topics Concern  . 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    Married. 2 children. One grandchild.   Diet-cutting back on carbs, one diet moutain dew in morning. Lots of water & lemon.    Exercise- working about Monday - Friday at gym   Current Meds  Medication Sig  . b complex vitamins capsule Take 1 capsule by mouth daily.  . Cholecalciferol (VITAMIN D3) 2000 units TABS Take by mouth daily.  . [DISCONTINUED] Cholecalciferol (VITAMIN D3) 5000 units CAPS Take by mouth daily.   Allergies  Allergen Reactions  . Morphine And Related Itching   Recent Results (from the past 2160 hour(s))  Comprehensive metabolic panel     Status: Abnormal   Collection Time: 03/07/17  8:29 AM  Result Value Ref Range   Sodium 137 135 - 145 mmol/L   Potassium 3.6 3.5 - 5.1 mmol/L   Chloride 103 101 - 111 mmol/L   CO2 23 22 - 32 mmol/L   Glucose, Bld 84 65 - 99 mg/dL   BUN 23 (H) 6 -  20 mg/dL   Creatinine, Ser 0.78 0.44 - 1.00 mg/dL   Calcium 9.2 8.9 - 10.3 mg/dL   Total Protein 8.4 (H) 6.5 - 8.1 g/dL   Albumin 4.8 3.5 - 5.0 g/dL   AST 21 15 - 41 U/L   ALT 19 14 - 54 U/L   Alkaline Phosphatase 78 38 - 126 U/L   Total Bilirubin 0.5 0.3 - 1.2 mg/dL   GFR calc non Af Amer >60 >60 mL/min   GFR calc Af Amer >60 >60 mL/min    Comment: (NOTE) The eGFR has been calculated using the CKD EPI equation. This calculation has not been validated in all clinical situations. eGFR's persistently <60 mL/min signify possible Chronic Kidney Disease.    Anion gap 11 5 - 15  CBC with Differential/Platelet     Status: None   Collection Time: 03/07/17  8:29 AM  Result Value Ref Range   WBC 6.3 3.6 - 11.0 K/uL   RBC 4.58 3.80 - 5.20 MIL/uL   Hemoglobin 13.6 12.0 - 16.0 g/dL   HCT 40.7 35.0 - 47.0 %   MCV 88.7 80.0 - 100.0 fL   MCH 29.7 26.0 - 34.0 pg    MCHC 33.5 32.0 - 36.0 g/dL   RDW 12.0 11.5 - 14.5 %   Platelets 272 150 - 440 K/uL   Neutrophils Relative % 55 %   Neutro Abs 3.5 1.4 - 6.5 K/uL   Lymphocytes Relative 37 %   Lymphs Abs 2.3 1.0 - 3.6 K/uL   Monocytes Relative 5 %   Monocytes Absolute 0.3 0.2 - 0.9 K/uL   Eosinophils Relative 2 %   Eosinophils Absolute 0.1 0 - 0.7 K/uL   Basophils Relative 1 %   Basophils Absolute 0.0 0 - 0.1 K/uL  Hemoglobin A1c     Status: Abnormal   Collection Time: 03/07/17  8:29 AM  Result Value Ref Range   Hgb A1c MFr Bld 5.7 (H) 4.8 - 5.6 %    Comment: (NOTE) Pre diabetes:          5.7%-6.4% Diabetes:              >6.4% Glycemic control for   <7.0% adults with diabetes    Mean Plasma Glucose 116.89 mg/dL    Comment: Performed at [facility], [place] [address] [place], [place] [address]  Lipid panel     Status: Abnormal   Collection Time: 03/07/17  8:29 AM  Result Value Ref Range   Cholesterol 225 (H) 0 - 200 mg/dL   Triglycerides 103 <150 mg/dL   HDL 47 >40 mg/dL   Total CHOL/HDL Ratio 4.8 RATIO   VLDL 21 0 - 40 mg/dL   LDL Cholesterol 157 (H) 0 - 99 mg/dL    Comment:        Total Cholesterol/HDL:CHD Risk Coronary Heart Disease Risk Table                     Men   Women  1/2 Average Risk   3.4   3.3  Average Risk       5.0   4.4  2 X Average Risk   9.6   7.1  3 X Average Risk  23.4   11.0        Use the calculated Patient Ratio above and the CHD Risk Table to determine the patient's CHD Risk.        ATP III CLASSIFICATION (LDL):  <100  mg/dL   Optimal  100-129  mg/dL   Near or Above                    Optimal  130-159  mg/dL   Borderline  160-189  mg/dL   High  >190     mg/dL   Very High   TSH     Status: None   Collection Time: 03/07/17  8:29 AM  Result Value Ref Range   TSH 1.334 0.350 - 4.500 uIU/mL    Comment: Performed by a 3rd Generation assay with a functional sensitivity of <=0.01 uIU/mL.  VITAMIN D 25 Hydroxy (Vit-D Deficiency, Fractures)     Status:  Abnormal   Collection Time: 03/07/17  8:29 AM  Result Value Ref Range   Vit D, 25-Hydroxy 28.8 (L) 30.0 - 100.0 ng/mL    Comment: (NOTE) Vitamin D deficiency has been defined by the [place] practice guideline as a level of serum 25-OH vitamin D less than 20 ng/mL (1,2). The Endocrine Society went on to further define vitamin D insufficiency as a level between 21 and 29 ng/mL (2). 1. IOM (Institute of Medicine). 2010. Dietary reference   intakes for calcium and D. [place]: The   [employer]. 2. Holick MF, Binkley [place], Bischoff-Ferrari HA, et al.   Evaluation, treatment, and prevention of vitamin D   deficiency: an Endocrine Society clinical practice   guideline. JCEM. 2011 Jul; 96(7):1911-30. Performed At: [facility] [place], [place] [id number] Rush Farmer MD AY:[id number]    Objective  Body mass index is 25.68 kg/m. Wt Readings from Last 3 Encounters:  03/13/17 147 lb 4 oz (66.8 kg)  02/29/16 143 lb 3.2 oz (65 kg)  12/28/15 143 lb (64.9 kg)   Temp Readings from Last 3 Encounters:  03/13/17 98.1 F (36.7 C) (Oral)  10/26/16 98.1 F (36.7 C)  02/29/16 98.2 F (36.8 C) (Oral)   BP Readings from Last 3 Encounters:  03/13/17 98/70  10/26/16 90/70  02/29/16 108/62   Pulse Readings from Last 3 Encounters:  03/13/17 82  10/26/16 61  02/29/16 77   Pulse oximetry on room air is 99% Physical Exam  Constitutional: She is oriented to person, place, and time and well-developed, well-nourished, and in no distress. Vital signs are normal.  HENT:  Head: Normocephalic and atraumatic.  Mouth/Throat: Oropharynx is clear and moist and mucous membranes are normal.  Eyes: Conjunctivae are normal. Pupils are equal, round, and reactive to light.  Cardiovascular: Normal rate, regular rhythm and normal heart sounds.  No murmur heard. Pulmonary/Chest: Effort normal and breath sounds normal. She has no  wheezes.  Abdominal: Soft. Bowel sounds are normal. There is no tenderness.  Neurological: She is alert and oriented to person, place, and time. Gait normal.  Skin: Skin is warm, dry and intact.  Psychiatric: Mood, memory, affect and judgment normal.  Nursing note and vitals reviewed.  Assessment   1. Annual exam  2. Elevated BUN  3. Elevated total protein  4. Vit D def  5. HLD 6. HM Plan  1.  Reviewed labs will add urine today  Given info on cholesterol  2. Check UA today  rec stop NSAIDS use prn Tylenol for pain.  and repeat labs in 4-6 months if still elevated with h/o elevated total protein consider check SPEP/UPEP at fu as MM, amyloid can cause elevated TP. Pt is drinking water adequately so less likely dehydration  3. See above 4.  Pt on 2000 or 5000 IU qd confirm dose at f/u  5.  See #1   6.  Had flu shot rec get Tdap will look into it at health dept or pharm Also disc shingrix vaccine today  Hep C/HIV net 02/2016   Never smoker or etoh  rec reconsider colonoscopy with Dr. Juanita Craver  rec repeat DEXA  02/2018 h/o osteopenia on 02/16/15 DEXA needs to take Vit D and calcium 600 mg bid   S/p hysterectomy Dr. Laurey Morale no h/o abnormal pap last pap 2016 obtained records Dr. Georga Bora  Get records from [place] [facility]  -pap 01/28/08 neg, 01/22/15 neg   mammo had 02/21/17 h/o breast masess benign f/u in 1 year   Continue healthy diet choices and exercise   Consider check hep B immunity sAb quant with next labs    Provider: Dr. Olivia Mackie McLean-Scocuzza

## 2017-04-25 ENCOUNTER — Telehealth: Payer: Self-pay

## 2017-04-25 NOTE — Telephone Encounter (Signed)
Copied from CRM [phone]. Topic: Inquiry >> Apr 25, 2017  4:35 PM Everardo PacificMoton, [place], [place] wrote: Reason for CRM: Patient called because she was told by the billing department that she needed to call the office because her mammogram was put into coding wrong. If someone could give her a call back about this at [phone]. Was told that an addendum needs to be done

## 2017-04-26 NOTE — Telephone Encounter (Signed)
I spoke with the patient to inform her that the radiologist suggestion was to follow up in a year (2018) with a bilateral mammogram and right breast ultrasound. After the mammogram in 2018 she is to have regular screening per the radiologist note. The patient is aware that the codes for the mammogram are correct for the exam.

## 2017-06-26 ENCOUNTER — Other Ambulatory Visit: Payer: Self-pay

## 2017-06-26 DIAGNOSIS — Z1211 Encounter for screening for malignant neoplasm of colon: Secondary | ICD-10-CM

## 2017-06-30 ENCOUNTER — Encounter: Payer: Self-pay | Admitting: *Deleted

## 2017-07-03 ENCOUNTER — Ambulatory Visit
Admission: RE | Admit: 2017-07-03 | Discharge: 2017-07-03 | Disposition: A | Discharge status: Home / Self Care | Payer: 59 | Source: Ambulatory Visit | Attending: Gastroenterology | Admitting: Gastroenterology

## 2017-07-03 ENCOUNTER — Encounter: Payer: Self-pay | Admitting: Certified Registered Nurse Anesthetist

## 2017-07-03 ENCOUNTER — Other Ambulatory Visit: Payer: Self-pay

## 2017-07-03 ENCOUNTER — Encounter
Admission: RE | Disposition: A | Discharge status: Home / Self Care | Payer: Self-pay | Source: Ambulatory Visit | Attending: Gastroenterology

## 2017-07-03 ENCOUNTER — Ambulatory Visit: Payer: 59 | Admitting: Certified Registered Nurse Anesthetist

## 2017-07-03 DIAGNOSIS — E559 Vitamin D deficiency, unspecified: Secondary | ICD-10-CM | POA: Insufficient documentation

## 2017-07-03 DIAGNOSIS — Z1211 Encounter for screening for malignant neoplasm of colon: Secondary | ICD-10-CM

## 2017-07-03 DIAGNOSIS — K5790 Diverticulosis of intestine, part unspecified, without perforation or abscess without bleeding: Secondary | ICD-10-CM | POA: Diagnosis not present

## 2017-07-03 DIAGNOSIS — K5730 Diverticulosis of large intestine without perforation or abscess without bleeding: Secondary | ICD-10-CM | POA: Diagnosis not present

## 2017-07-03 DIAGNOSIS — M8580 Other specified disorders of bone density and structure, unspecified site: Secondary | ICD-10-CM | POA: Insufficient documentation

## 2017-07-03 DIAGNOSIS — Z79899 Other long term (current) drug therapy: Secondary | ICD-10-CM | POA: Insufficient documentation

## 2017-07-03 HISTORY — PX: COLONOSCOPY WITH PROPOFOL: SHX5780

## 2017-07-03 SURGERY — COLONOSCOPY WITH PROPOFOL
Anesthesia: General

## 2017-07-03 MED ORDER — SODIUM CHLORIDE 0.9 % IV SOLN
INTRAVENOUS | Status: DC
  Administered 2017-07-03: 08:00:00 via INTRAVENOUS

## 2017-07-03 MED ORDER — PROPOFOL 500 MG/50ML IV EMUL
INTRAVENOUS | Status: AC
  Filled 2017-07-03: qty 50

## 2017-07-03 MED ORDER — LIDOCAINE HCL (CARDIAC) 20 MG/ML IV SOLN
INTRAVENOUS | Status: DC | PRN
  Administered 2017-07-03: 50 mg via INTRAVENOUS

## 2017-07-03 MED ORDER — PROPOFOL 10 MG/ML IV BOLUS
INTRAVENOUS | Status: DC | PRN
  Administered 2017-07-03 (×2): 30 mg via INTRAVENOUS
  Administered 2017-07-03: 100 mg via INTRAVENOUS
  Administered 2017-07-03: 60 mg via INTRAVENOUS
  Administered 2017-07-03: 40 mg via INTRAVENOUS

## 2017-07-03 NOTE — Anesthesia Postprocedure Evaluation (Signed)
Anesthesia Post Note  Patient: Tammy Chang  Procedure(s) Performed: COLONOSCOPY WITH PROPOFOL (N/A )  Patient location during evaluation: Endoscopy Anesthesia Type: General Level of consciousness: awake and alert Pain management: pain level controlled Vital Signs Assessment: post-procedure vital signs reviewed and stable Respiratory status: spontaneous breathing, nonlabored ventilation and respiratory function stable Cardiovascular status: blood pressure returned to baseline and stable Postop Assessment: no apparent nausea or vomiting Anesthetic complications: no     Last Vitals:  Vitals:   07/03/17 0850 07/03/17 0900  BP: (!) 89/65 93/75  Pulse: 63 60  Resp: 13 18  Temp:    SpO2: 96% 98%    Last Pain:  Vitals:   07/03/17 0900  TempSrc:   PainSc: 0-No pain                 Christia ReadingScott T Deah Ottaway

## 2017-07-03 NOTE — Anesthesia Preprocedure Evaluation (Addendum)
Anesthesia Evaluation  Patient identified by MRN, date of birth, ID band Patient awake    Reviewed: Allergy & Precautions, H&P , NPO status , reviewed documented beta blocker date and time   Airway Mallampati: II  TM Distance: >3 FB     Dental no notable dental hx. (+) Teeth Intact   Pulmonary neg pulmonary ROS,    Pulmonary exam normal        Cardiovascular negative cardio ROS Normal cardiovascular exam     Neuro/Psych negative neurological ROS  negative psych ROS   GI/Hepatic Neg liver ROS, neg GERD  ,  Endo/Other  negative endocrine ROS  Renal/GU negative Renal ROS  negative genitourinary   Musculoskeletal   Abdominal   Peds  Hematology negative hematology ROS (+)   Anesthesia Other Findings   Reproductive/Obstetrics                             Anesthesia Physical Anesthesia Plan  ASA: I  Anesthesia Plan: General   Post-op Pain Management:    Induction:   PONV Risk Score and Plan: 3 and Propofol infusion and TIVA  Airway Management Planned:   Additional Equipment:   Intra-op Plan:   Post-operative Plan:   Informed Consent: I have reviewed the patients History and Physical, chart, labs and discussed the procedure including the risks, benefits and alternatives for the proposed anesthesia with the patient or authorized representative who has indicated his/her understanding and acceptance.   Dental Advisory Given  Plan Discussed with: CRNA  Anesthesia Plan Comments:         Anesthesia Quick Evaluation

## 2017-07-03 NOTE — H&P (Signed)
Wyline MoodKiran Avalin Briley, MD [address], [address], [place], [place], [id number] [address], [address], [place], [place], [address] Phone: [phone]  Fax: [phone]  Primary Care Physician:  Allegra GranaArnett, Margaret G, FNP   Pre-Procedure History & Physical: HPI:  Tammy Chang is a 57 y.o. female is here for an colonoscopy.   Past Medical History:  Diagnosis Date  . Lump or mass in breast 2011   LEFT  . Osteopenia    DEXA 02/2015   . Vitamin D deficiency     Past Surgical History:  Procedure Laterality Date  . ABDOMINAL HYSTERECTOMY  2000  . BREAST BIOPSY Left    negative 02/25/2010  . BREAST EXCISIONAL BIOPSY Left 2011   negative 03/29/2010  . CESAREAN SECTION  [id number],1996  . TONSILLECTOMY  2009    Prior to Admission medications   Medication Sig Start Date End Date Taking? Authorizing Provider  b complex vitamins capsule Take 1 capsule by mouth daily.   Yes [provider]  Cholecalciferol (VITAMIN D3) 2000 units TABS Take by mouth daily.   Yes [provider]    Allergies as of 06/26/2017 - Review Complete 03/13/2017  Allergen Reaction Noted  . Morphine and related Itching 05/20/2012    Family History  Problem Relation Age of Onset  . Heart disease Mother        chf  . Hypertension Mother   . Kidney disease Mother   . Thyroid disease Mother        had thyroid removed 2/2 goiter   . Hyperlipidemia Mother   . Alcohol abuse Father   . Diabetes Father   . Cirrhosis Father   . Breast cancer Maternal Aunt     Social History   Socioeconomic History  . Marital status: Married    Spouse name: Not on file  . Number of children: Not on file  . Years of education: Not on file  . Highest education level: Not on file  Occupational History  . Not on file  Social Needs  . Financial resource strain: Not on file  . Food insecurity:    Worry: Not on file    Inability: Not on file  . Transportation needs:    Medical: Not on file    Non-medical:  Not on file  Tobacco Use  . Smoking status: Never Smoker  . Smokeless tobacco: Never Used  Substance and Sexual Activity  . Alcohol use: No  . Drug use: No  . Sexual activity: Not on file  Lifestyle  . Physical activity:    Days per week: Not on file    Minutes per session: Not on file  . Stress: Not on file  Relationships  . Social connections:    Talks on phone: Not on file    Gets together: Not on file    Attends religious service: Not on file    Active member of club or organization: Not on file    Attends meetings of clubs or organizations: Not on file    Relationship status: Not on file  . Intimate partner violence:    Fear of current or ex partner: Not on file    Emotionally abused: Not on file    Physically abused: Not on file    Forced sexual activity: Not on file  Other Topics Concern  . 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    Married. 2 children. One grandchild.  Diet-cutting back on carbs, one diet moutain dew in morning. Lots of water & lemon.    Exercise- working about Monday - Friday at gym    Review of Systems: See HPI, otherwise negative ROS  Physical Exam: BP 106/75   Pulse 72   Temp (!) 96.7 F (35.9 C) (Tympanic)   Resp 16   Ht 5\' 4"  (1.626 m)   Wt 147 lb (66.7 kg)   SpO2 100%   BMI 25.23 kg/m  General:   Alert,  pleasant and cooperative in NAD Head:  Normocephalic and atraumatic. Neck:  Supple; no masses or thyromegaly. Lungs:  Clear throughout to auscultation, normal respiratory effort.    Heart:  +S1, +S2, Regular rate and rhythm, No edema. Abdomen:  Soft, nontender and nondistended. Normal bowel sounds, without guarding, and without rebound.   Neurologic:  Alert and  oriented x4;  grossly normal neurologically.  Impression/Plan: Tammy Chang is here for an colonoscopy to be performed for Screening colonoscopy average risk   Risks, benefits, limitations, and alternatives regarding   colonoscopy have been reviewed with the patient.  Questions have been answered.  All parties agreeable.   Wyline Mood, MD  07/03/2017, 8:06 AM

## 2017-07-03 NOTE — Op Note (Signed)
[facility] Gastroenterology Patient Name: Tammy Chang Procedure Date: 07/03/2017 7:40 AM MRN: [id number] Account #: [id number] Date of Birth: [birth year] Admit Type: Outpatient Age: 57 Room: [facility] ENDO ROOM 4 Gender: Female Note Status: Finalized Procedure:            Colonoscopy Indications:          Screening for colorectal malignant neoplasm Providers:            Wyline Mood MD, MD Referring MD:         Lyn Records. Arnett (Referring MD) Medicines:            Monitored Anesthesia Care Complications:        No immediate complications. Procedure:            Pre-Anesthesia Assessment:                       - Prior to the procedure, a History and Physical was                        performed, and patient medications, allergies and                        sensitivities were reviewed. The patient's tolerance of                        previous anesthesia was reviewed.                       - The risks and benefits of the procedure and the                        sedation options and risks were discussed with the                        patient. All questions were answered and informed                        consent was obtained.                       - ASA Grade Assessment: II - A patient with mild                        systemic disease.                       After obtaining informed consent, the colonoscope was                        passed under direct vision. Throughout the procedure,                        the patient's blood pressure, pulse, and oxygen                        saturations were monitored continuously. The                        Colonoscope was introduced through the anus and  advanced to the the cecum, identified by the                        appendiceal orifice, IC valve and transillumination.                        The colonoscopy was performed with ease. The patient                        tolerated the procedure well. The  quality of the bowel                        preparation was good. Findings:      The perianal and digital rectal examinations were normal.      A single small-mouthed diverticulum was found in the ascending colon.      The exam was otherwise without abnormality on direct and retroflexion       views. Impression:           - Diverticulosis in the ascending colon.                       - The examination was otherwise normal on direct and                        retroflexion views.                       - No specimens collected. Recommendation:       - Discharge patient to home (with escort).                       - Resume previous diet.                       - Continue present medications.                       - Repeat colonoscopy in 10 years for screening purposes. Procedure Code(s):    --- Professional ---                       [phone], Colonoscopy, flexible; diagnostic, including                        collection of specimen(s) by brushing or washing, when                        performed (separate procedure) Diagnosis Code(s):    --- Professional ---                       Z12.11, Encounter for screening for malignant neoplasm                        of colon                       K57.30, Diverticulosis of large intestine without                        perforation or abscess without bleeding CPT copyright 2016 American Medical Association. All rights reserved. The codes documented in this report are  preliminary and upon coder review may  be revised to meet current compliance requirements. Wyline MoodKiran Tashya Alberty, MD Wyline MoodKiran Izaih Kataoka MD, MD 07/03/2017 8:28:49 AM This report has been signed electronically. Number of Addenda: 0 Note Initiated On: 07/03/2017 7:40 AM Scope Withdrawal Time: 0 hours 9 minutes 1 second  Total Procedure Duration: 0 hours 13 minutes 43 seconds       [facility]

## 2017-07-03 NOTE — Anesthesia Post-op Follow-up Note (Signed)
Anesthesia QCDR form completed.        

## 2017-07-03 NOTE — Transfer of Care (Signed)
Immediate Anesthesia Transfer of Care Note  Patient: Tammy Chang  Procedure(s) Performed: COLONOSCOPY WITH PROPOFOL (N/A )  Patient Location: PACU and Endoscopy Unit  Anesthesia Type:General  Level of Consciousness: awake  Airway & Oxygen Therapy: Patient Spontanous Breathing  Post-op Assessment: Report given to RN  Post vital signs: stable  Last Vitals:  Vitals Value Taken Time  BP    Temp 36.3 C 07/03/2017  8:30 AM  Pulse 58 07/03/2017  8:30 AM  Resp 14 07/03/2017  8:30 AM  SpO2 99 % 07/03/2017  8:30 AM    Last Pain:  Vitals:   07/03/17 0732  TempSrc: Tympanic         Complications: No apparent anesthesia complications

## 2017-07-04 ENCOUNTER — Encounter: Payer: Self-pay | Admitting: Gastroenterology

## 2017-07-13 ENCOUNTER — Ambulatory Visit: Payer: Self-pay | Admitting: Internal Medicine

## 2017-08-09 ENCOUNTER — Telehealth: Payer: 59 | Admitting: Nurse Practitioner

## 2017-08-09 DIAGNOSIS — J329 Chronic sinusitis, unspecified: Secondary | ICD-10-CM | POA: Diagnosis not present

## 2017-08-09 DIAGNOSIS — B9789 Other viral agents as the cause of diseases classified elsewhere: Secondary | ICD-10-CM | POA: Diagnosis not present

## 2017-08-09 MED ORDER — FLUTICASONE PROPIONATE 50 MCG/ACT NA SUSP: 2.0000 | Freq: Every day | NASAL | 6 refills | Status: DC

## 2017-08-09 NOTE — Progress Notes (Signed)
We are sorry that you are not feeling well.  Here is how we plan to help!  Based on what you have shared with me it looks like you have sinusitis.  Sinusitis is inflammation and infection in the sinus cavities of the head.  Based on your presentation I believe you most likely have Acute Viral Sinusitis.This is an infection most likely caused by a virus. There is not specific treatment for viral sinusitis other than to help you with the symptoms until the infection runs its course.  You may use an oral decongestant such as Mucinex D or if you have glaucoma or high blood pressure use plain Mucinex. Saline nasal spray help and can safely be used as often as needed for congestion, I have prescribed: Fluticasone nasal spray two sprays in each nostril once a day.  Providers prescribe antibiotics to treat infections caused by bacteria. Antibiotics are very powerful in treating bacterial infections when they are used properly. To maintain their effectiveness, they should be used only when necessary. Overuse of antibiotics has resulted in the development of superbugs that are resistant to treatment!    After careful review of your answers, I would not recommend an antibiotic for your condition.  Antibiotics are not effective against viruses and therefore should not be used to treat them. Common examples of infections caused by viruses include colds and flu.  Some authorities believe that zinc sprays or the use of Echinacea may shorten the course of your symptoms.  Sinus infections are not as easily transmitted as other respiratory infection, however we still recommend that you avoid close contact with loved ones, especially the very young and elderly.  Remember to wash your hands thoroughly throughout the day as this is the number one way to prevent the spread of infection!  Home Care:  Only take medications as instructed by your medical team.  Do not take these medications with alcohol.  A steam or  ultrasonic humidifier can help congestion.  You can place a towel over your head and breathe in the steam from hot water coming from a faucet.  Avoid close contacts especially the very young and the elderly.  Cover your mouth when you cough or sneeze.  Always remember to wash your hands.  Get Help Right Away If:  You develop worsening fever or sinus pain.  You develop a severe head ache or visual changes.  Your symptoms persist after you have completed your treatment plan.  Make sure you  Understand these instructions.  Will watch your condition.  Will get help right away if you are not doing well or get worse.  Your e-visit answers were reviewed by a board certified advanced clinical practitioner to complete your personal care plan.  Depending on the condition, your plan could have included both over the counter or prescription medications.  If there is a problem please reply  once you have received a response from your provider.  Your safety is important to us.  If you have drug allergies check your prescription carefully.    You can use MyChart to ask questions about today's visit, request a non-urgent call back, or ask for a work or school excuse for 24 hours related to this e-Visit. If it has been greater than 24 hours you will need to follow up with your provider, or enter a new e-Visit to address those concerns.  You will get an e-mail in the next two days asking about your experience.  I hope that your   e-visit has been valuable and will speed your recovery. Thank you for using e-visits.     

## 2017-08-25 ENCOUNTER — Ambulatory Visit (INDEPENDENT_AMBULATORY_CARE_PROVIDER_SITE_OTHER): Payer: Self-pay | Admitting: Medical

## 2017-08-25 ENCOUNTER — Telehealth: Payer: 59 | Admitting: Family

## 2017-08-25 VITALS — Temp 98.4°F

## 2017-08-25 DIAGNOSIS — W57XXXA Bitten or stung by nonvenomous insect and other nonvenomous arthropods, initial encounter: Secondary | ICD-10-CM | POA: Diagnosis not present

## 2017-08-25 DIAGNOSIS — Z23 Encounter for immunization: Secondary | ICD-10-CM

## 2017-08-25 DIAGNOSIS — S2096XA Insect bite (nonvenomous) of unspecified parts of thorax, initial encounter: Secondary | ICD-10-CM | POA: Diagnosis not present

## 2017-08-25 MED ORDER — DOXYCYCLINE HYCLATE 100 MG PO TABS: 100.0000 mg | ORAL_TABLET | Freq: Two times a day (BID) | ORAL | 0 refills | Status: DC

## 2017-08-25 NOTE — Progress Notes (Signed)
Thank you for the details you included in the comment boxes. Those details are very helpful in determining the best course of treatment for you and help [place] to provide the best care. Given the way the bite site looks, it was likely there more than 24h and I must recommend a longer antibiotic course. If you disagree with this and feel strongly that it was connected less than 24h, you may opt to use the doxycycline  (2 pills) instead. However, the benefit/risk ratio is far better to take the longer course rather than risk long-term complications later.   Thank you for describing your tick bite, Here is how we plan to help! Based on the information that you shared with me it looks like you have An infected or complicated tick bite that requires a longer course of antibiotics and will need for you to schedule a follow-up visit with a provider.  In most cases a tick bite is painless and does not itch.  Most tick bites in which the tick is quickly removed do not require prescriptions. Ticks can transmit several diseases if they are infected and remain attacked to your skin. Therefore the length that the tick was attached and any symptoms you have experienced after the bite are import to accurately develop your custom treatment plan. In most cases a single dose of doxycycline may prevent the development of a more serious condition.  Based on your information I have Provided a home care guide for tick bites  and  instructions on when to call for help. and Your symptoms indicate that you need a longer course of antibiotics and a follow up visit with a provider. I have sent doxycycline 100 mg twice a day for 21 days to the pharmacy that you selected. You will need to schedule a follow up visit with your provider. If you do not have a primary care provider you may use our telehealth physicians on the web at [facility].  Which ticks  are associated with illness?  The Wood Tick (dog tick) is the size of  a watermelon seed and can sometimes transmit [facility] spotted fever and [place] tick fever.   The Deer Tick (black-legged tick) is between the size of a poppy seed (pin head) and an apple seed, and can sometimes transmit Lyme disease.  A brown to black tick with a white splotch on its back is likely a female Amblyomma americanum ([place] tick). This tick has been associated with Southern Tick Associated illness ( STARI)  Lyme disease has become the most common tick-borne illness in the [place]. The risk of Lyme disease following a recognized deer tick bite is estimated to be 1%.  The majority of cases of Lyme disease start with a bull's eye rash at the site of the tick bite. The rash can occur days to weeks (typically 7-10 days) after a tick bite. Treatment with antibiotics is indicated if this rash appears. Flu-like symptoms may accompany the rash, including: fever, chills, headaches, muscle aches, and fatigue. Removing ticks promptly may prevent tick borne disease.  What can be used to prevent Tick Bites?   Insect repellant with at leas 20% DEET.  Wearing long pants with sock and shoes.  Avoiding tall grass and heavily wooded areas.  Checking your skin after being outdoors.  Shower with a washcloth after outdoor exposures.  HOME CARE ADVICE FOR TICK BITE  1. Wood Tick Removal:  o Use a pair of tweezers and grasp the wood tick  close to the skin (on its head). Pull the wood tick straight upward without twisting or crushing it. Maintain a steady pressure until it releases its grip.   o If tweezers aren't available, use fingers, a loop of thread around the jaws, or a needle between the jaws for traction.  o Note: covering the tick with petroleum jelly, nail polish or rubbing alcohol doesn't work. Neither does touching the tick with a hot or cold object. 2. Tiny Deer Tick Removal:   o Needs to be scraped off with a knife blade or credit card edge. o Place tick in a sealed  container (e.g. glass jar, zip lock plastic bag), in case your doctor wants to see it. 3. Tick's Head Removal:  o If the wood tick's head breaks off in the skin, it must be removed. Clean the skin. Then use a sterile needle to uncover the head and lift it out or scrape it off.  o If a very small piece of the head remains, the skin will eventually slough it off. 4. Antibiotic Ointment:  o Wash the wound and your hands with soap and water after removal to prevent catching any tick disease.  Apply an over the counter antibiotic ointment (e.g. bacitracin) to the bite once. 5. Expected Course: Tick bites normally don't itch or hurt. That's why they often go unnoticed. 6. Call Your Doctor If:  o You can't remove the tick or the tick's head o Fever, a severe head ache, or rash occur in the next 2 weeks o Bite begins to look infected o Lyme's disease is common in your area o You have not had a tetanus in the last 10 years o Your current symptoms become worse    MAKE SURE YOU   Understand these instructions.  Will watch your condition.  Will get help right away if you are not doing well or get worse.   Thank you for choosing an e-visit.  Your e-visit answers were reviewed by a board certified advanced clinical practitioner to complete your personal care plan. Depending upon the condition, your plan could have included both over the counter or prescription medications. Please review your pharmacy choice. If there is a problem you may use MyChart messaging to have the prescription routed to another pharmacy. Your safety is important to [place]. If you have drug allergies check your prescription carefully.   You can use MyChart to ask questions about today's visit, request a non-urgent call back, or ask for a work or school excuse for 24 hours related to this e-Visit. If it has been greater than 24 hours you will need to follow up with your provider, or enter a new e-Visit to address those  concerns.  You will get an email in the next two days asking about your experience. I hope  that your e-visit has been valuable and will speed your recovery

## 2017-08-25 NOTE — Progress Notes (Signed)
Tdap given only right deltoid. No allergic reaction

## 2017-10-17 ENCOUNTER — Telehealth: Payer: 59 | Admitting: Physician Assistant

## 2017-10-17 DIAGNOSIS — J069 Acute upper respiratory infection, unspecified: Secondary | ICD-10-CM

## 2017-10-17 DIAGNOSIS — B9789 Other viral agents as the cause of diseases classified elsewhere: Secondary | ICD-10-CM

## 2017-10-17 MED ORDER — BENZONATATE 100 MG PO CAPS: 100.0000 mg | ORAL_CAPSULE | Freq: Three times a day (TID) | ORAL | 0 refills | Status: DC

## 2017-10-17 NOTE — Progress Notes (Signed)
We are sorry you are not feeling well.  Here is how we plan to help!  Based on what you have shared with me, it looks like you may have a viral upper respiratory infection or a "common cold".  Colds are caused by a large number of viruses; however, rhinovirus is the most common cause.   Symptoms of the common cold vary from person to person, with common symptoms including sore throat, cough, and malaise.  A low-grade fever of 100.4 may present, but is often uncommon.  Symptoms vary however, and are closely related to a person's age or underlying illnesses.  The most common symptoms associated with the common cold are nasal discharge or congestion, cough, sneezing, headache and pressure in the ears and face.  Cold symptoms usually persist for about 3 to 10 days, but can last up to 2 weeks.  It is important to know that colds do not cause serious illness or complications in most cases.    The common cold is transmitted from person to person, with the most common method of transmission being a person's hands.  The virus is able to live on the skin and can infect other persons for up to 2 hours after direct contact.  Also, colds are transmitted when someone coughs or sneezes; thus, it is important to cover the mouth to reduce this risk.  To keep the spread of the common cold at bay, good hand hygiene is very important.  This is an infection that is most likely caused by a virus. There are no specific treatments for the common cold other than to help you with the symptoms until the infection runs its course.    For nasal congestion, you may use an oral decongestants such as Mucinex D or if you have glaucoma or high blood pressure use plain Mucinex.  Saline nasal spray or nasal drops can help and can safely be used as often as needed for congestion.   If you do not have a history of heart disease, hypertension, diabetes or thyroid disease, prostate/bladder issues or glaucoma, you may also use Sudafed to treat  nasal congestion.  It is highly recommended that you consult with a pharmacist or your primary care physician to ensure this medication is safe for you to take.     If you have a cough, you may use cough suppressants such as Delsym and Robitussin.  If you have glaucoma or high blood pressure, you can also use Coricidin HBP.   For cough I have prescribed for you A prescription cough medication called Tessalon Perles 100 mg. You may take 1-2 capsules every 8 hours as needed for cough  If you have a sore or scratchy throat, use a saltwater gargle-  to  teaspoon of salt dissolved in a 4-ounce to 8-ounce glass of warm water.  Gargle the solution for approximately 15-30 seconds and then spit.  It is important not to swallow the solution.  You can also use throat lozenges/cough drops and Chloraseptic spray to help with throat pain or discomfort.  Warm or cold liquids can also be helpful in relieving throat pain.  For headache, pain or general discomfort, you can use Ibuprofen or Tylenol as directed.   Some authorities believe that zinc sprays or the use of Echinacea may shorten the course of your symptoms.   HOME CARE . Only take medications as instructed by your medical team. . Be sure to drink plenty of fluids. Water is fine as well as fruit   juices, sodas and electrolyte beverages. You may want to stay away from caffeine or alcohol. If you are nauseated, try taking small sips of liquids. How do you know if you are getting enough fluid? Your urine should be a pale yellow or almost colorless. . Get rest. . Taking a steamy shower or using a humidifier may help nasal congestion and ease sore throat pain. You can place a towel over your head and breathe in the steam from hot water coming from a faucet. . Using a saline nasal spray works much the same way. . Cough drops, hard candies and sore throat lozenges may ease your cough. . Avoid close contacts especially the very young and the elderly . Cover your  mouth if you cough or sneeze . Always remember to wash your hands.   GET HELP RIGHT AWAY IF: . You develop worsening fever. . If your symptoms do not improve within 10 days . You become short of breath. . You develop yellow or green discharge from your nose over 3 days. . You have coughing fits . You develop a severe head ache or visual changes. . You develop shortness of breath or difficulty breathing. . Your symptoms persist after you have completed your treatment plan  MAKE SURE YOU   Understand these instructions.  Will watch your condition.  Will get help right away if you are not doing well or get worse.  Your e-visit answers were reviewed by a board certified advanced clinical practitioner to complete your personal care plan. Depending upon the condition, your plan could have included both over the counter or prescription medications. Please review your pharmacy choice. If there is a problem, you may call our nursing hot line at and have the prescription routed to another pharmacy. Your safety is important to us. If you have drug allergies check your prescription carefully.   You can use MyChart to ask questions about today's visit, request a non-urgent call back, or ask for a work or school excuse for 24 hours related to this e-Visit. If it has been greater than 24 hours you will need to follow up with your provider, or enter a new e-Visit to address those concerns. You will get an e-mail in the next two days asking about your experience.  I hope that your e-visit has been valuable and will speed your recovery. Thank you for using e-visits.      

## 2018-01-08 DIAGNOSIS — H5203 Hypermetropia, bilateral: Secondary | ICD-10-CM | POA: Diagnosis not present

## 2018-01-20 ENCOUNTER — Telehealth: Payer: 59 | Admitting: Nurse Practitioner

## 2018-01-20 DIAGNOSIS — N3000 Acute cystitis without hematuria: Secondary | ICD-10-CM

## 2018-01-20 MED ORDER — CIPROFLOXACIN HCL 500 MG PO TABS: 500.0000 mg | ORAL_TABLET | Freq: Two times a day (BID) | ORAL | 0 refills | Status: DC

## 2018-01-20 NOTE — Progress Notes (Signed)

## 2018-02-20 ENCOUNTER — Telehealth: Payer: Self-pay

## 2018-02-20 DIAGNOSIS — Z0000 Encounter for general adult medical examination without abnormal findings: Secondary | ICD-10-CM

## 2018-02-20 NOTE — Telephone Encounter (Signed)
Copied from CRM [phone]#[id number]. Topic: General - Other >> Feb 20, 2018  9:51 AM Fanny BienIlderton, Jessica L wrote: Reason for CRM: pt called and stated that she would like to have yours lab put in over at the med center of [place]. Please advise Cb#[phone]. Pt also stated that she would like an order for a mammogram screening. Preferred appointment after 2:30.

## 2018-02-21 NOTE — Telephone Encounter (Signed)
Call pt I ordered labs- I am not sure how to order for [place]. She will need to call them to see if they can see my orders.  Other option is we can print lab slips.   Mammogram ordered. Please advise pt that she will need to make that appt. We no longer schedule screening mammograms so that patient's can schedule based on their convenience.

## 2018-02-21 NOTE — Addendum Note (Signed)
Addended by: Allegra GranaARNETT, MARGARET G on: 02/21/2018 09:45 PM   Modules accepted: Orders

## 2018-02-22 NOTE — Telephone Encounter (Signed)
Spoke with patient advised of below and verbalized understanding. [facility] will [facility]. Advised her that they can schedule mammogram

## 2018-02-26 ENCOUNTER — Ambulatory Visit
Admission: RE | Admit: 2018-02-26 | Discharge: 2018-02-26 | Disposition: A | Discharge status: Home / Self Care | Payer: 59 | Source: Ambulatory Visit | Attending: Family | Admitting: Family

## 2018-02-26 DIAGNOSIS — Z0000 Encounter for general adult medical examination without abnormal findings: Secondary | ICD-10-CM | POA: Diagnosis not present

## 2018-02-26 DIAGNOSIS — Z1231 Encounter for screening mammogram for malignant neoplasm of breast: Secondary | ICD-10-CM | POA: Diagnosis not present

## 2018-03-12 ENCOUNTER — Other Ambulatory Visit
Admission: RE | Admit: 2018-03-12 | Discharge: 2018-03-12 | Disposition: A | Discharge status: Home / Self Care | Payer: 59 | Source: Ambulatory Visit | Attending: Family | Admitting: Family

## 2018-03-12 DIAGNOSIS — Z0000 Encounter for general adult medical examination without abnormal findings: Secondary | ICD-10-CM | POA: Diagnosis not present

## 2018-03-12 LAB — CBC WITH DIFFERENTIAL/PLATELET
Abs Immature Granulocytes: 0.01 10*3/uL (ref 0.00–0.07)
BASOS ABS: 0 10*3/uL (ref 0.0–0.1)
BASOS PCT: 1 %
EOS PCT: 2 %
Eosinophils Absolute: 0.1 10*3/uL (ref 0.0–0.5)
HCT: 40.9 % (ref 36.0–46.0)
HEMOGLOBIN: 13.5 g/dL (ref 12.0–15.0)
Immature Granulocytes: 0 %
LYMPHS ABS: 2.3 10*3/uL (ref 0.7–4.0)
Lymphocytes Relative: 38 %
MCH: 29.3 pg (ref 26.0–34.0)
MCHC: 33 g/dL (ref 30.0–36.0)
MCV: 88.7 fL (ref 80.0–100.0)
MONO ABS: 0.4 10*3/uL (ref 0.1–1.0)
MONOS PCT: 6 %
Neutro Abs: 3.3 10*3/uL (ref 1.7–7.7)
Neutrophils Relative %: 53 %
PLATELETS: 268 10*3/uL (ref 150–400)
RBC: 4.61 MIL/uL (ref 3.87–5.11)
RDW: 12 % (ref 11.5–15.5)
WBC: 6.1 10*3/uL (ref 4.0–10.5)
nRBC: 0 % (ref 0.0–0.2)

## 2018-03-12 LAB — COMPREHENSIVE METABOLIC PANEL
ALBUMIN: 4.6 g/dL (ref 3.5–5.0)
ALK PHOS: 77 U/L (ref 38–126)
ALT: 19 U/L (ref 0–44)
ANION GAP: 10 (ref 5–15)
AST: 23 U/L (ref 15–41)
BUN: 24 mg/dL — ABNORMAL HIGH (ref 6–20)
CHLORIDE: 102 mmol/L (ref 98–111)
CO2: 27 mmol/L (ref 22–32)
Calcium: 9.6 mg/dL (ref 8.9–10.3)
Creatinine, Ser: 0.72 mg/dL (ref 0.44–1.00)
GFR calc non Af Amer: >60 mL/min (ref >60)
GLUCOSE: 98 mg/dL (ref 70–99)
POTASSIUM: 4.1 mmol/L (ref 3.5–5.1)
SODIUM: 139 mmol/L (ref 135–145)
Total Bilirubin: 0.6 mg/dL (ref 0.3–1.2)
Total Protein: 8.3 g/dL — ABNORMAL HIGH (ref 6.5–8.1)

## 2018-03-12 LAB — LIPID PANEL
CHOL/HDL RATIO: 5.2 ratio
CHOLESTEROL: 235 mg/dL — AB (ref 0–200)
HDL: 45 mg/dL (ref >40)
LDL Cholesterol: 162 mg/dL — ABNORMAL HIGH (ref 0–99)
TRIGLYCERIDES: 139 mg/dL (ref <150)
VLDL: 28 mg/dL (ref 0–40)

## 2018-03-12 LAB — TSH: TSH: 1.58 u[IU]/mL (ref 0.350–4.500)

## 2018-03-12 LAB — HEMOGLOBIN A1C
Hgb A1c MFr Bld: 5.4 % (ref 4.8–5.6)
MEAN PLASMA GLUCOSE: 108.28 mg/dL

## 2018-03-13 LAB — VITAMIN D 25 HYDROXY (VIT D DEFICIENCY, FRACTURES): VIT D 25 HYDROXY: 25.7 ng/mL — AB (ref 30.0–100.0)

## 2018-03-16 ENCOUNTER — Ambulatory Visit (INDEPENDENT_AMBULATORY_CARE_PROVIDER_SITE_OTHER): Payer: 59 | Admitting: Family

## 2018-03-16 ENCOUNTER — Encounter: Payer: Self-pay | Admitting: Family

## 2018-03-16 VITALS — BP 98/72 | HR 78 | Temp 97.9°F | Wt 140.4 lb

## 2018-03-16 DIAGNOSIS — R5383 Other fatigue: Secondary | ICD-10-CM | POA: Diagnosis not present

## 2018-03-16 DIAGNOSIS — R778 Other specified abnormalities of plasma proteins: Secondary | ICD-10-CM | POA: Diagnosis not present

## 2018-03-16 DIAGNOSIS — Z0000 Encounter for general adult medical examination without abnormal findings: Secondary | ICD-10-CM

## 2018-03-16 MED ORDER — BUPROPION HCL ER (XL) 150 MG PO TB24: ORAL_TABLET | ORAL | 3 refills | Status: DC

## 2018-03-16 NOTE — Assessment & Plan Note (Signed)
Trial Wellbutrin for fatigue, decreased libido.  Follow-up in 3 to 6 months.

## 2018-03-16 NOTE — Assessment & Plan Note (Signed)
Improved. Chronic for 6 years.  Pending urine, serum studies.  I have also sent a message Dr Racheal PatchesBrahmandy, to ensure no further work-up will be advised.  Patient will let me know if she is not heard from me regarding this.  Emphasized several times during our office visit.

## 2018-03-16 NOTE — Assessment & Plan Note (Signed)
Patient up-to-date on mammogram, colonoscopy.  She politely declines clinical breast exam in the office today.  Encouraged continued exercise.  Congratulated patient on weight loss.

## 2018-03-16 NOTE — Patient Instructions (Addendum)
Please call me if you have not heard from me with regarding Dr Arthor Captain ( heme/onc) response.   I want to ensure that we fully close the loop on your elevated protein as we discussed today.  Trial of wellbutrin. Titrate up ( and wean off) as we discussed   Health Maintenance for Postmenopausal Women Menopause is a normal process in which your reproductive ability comes to an end. This process happens gradually over a span of months to years, usually between the ages of 41 and 64. Menopause is complete when you have missed 12 consecutive menstrual periods. It is important to talk with your health care provider about some of the most common conditions that affect postmenopausal women, such as heart disease, cancer, and bone loss (osteoporosis). Adopting a healthy lifestyle and getting preventive care can help to promote your health and wellness. Those actions can also lower your chances of developing some of these common conditions. What should I know about menopause? During menopause, you may experience a number of symptoms, such as:  Moderate-to-severe hot flashes.  Night sweats.  Decrease in sex drive.  Mood swings.  Headaches.  Tiredness.  Irritability.  Memory problems.  Insomnia.  Choosing to treat or not to treat menopausal changes is an individual decision that you make with your health care provider. What should I know about hormone replacement therapy and supplements? Hormone therapy products are effective for treating symptoms that are associated with menopause, such as hot flashes and night sweats. Hormone replacement carries certain risks, especially as you become older. If you are thinking about using estrogen or estrogen with progestin treatments, discuss the benefits and risks with your health care provider. What should I know about heart disease and stroke? Heart disease, heart attack, and stroke become more likely as you age. This may be due, in part, to the  hormonal changes that your body experiences during menopause. These can affect how your body processes dietary fats, triglycerides, and cholesterol. Heart attack and stroke are both medical emergencies. There are many things that you can do to help prevent heart disease and stroke:  Have your blood pressure checked at least every 1-2 years. High blood pressure causes heart disease and increases the risk of stroke.  If you are 53-71 years old, ask your health care provider if you should take aspirin to prevent a heart attack or a stroke.  Do not use any tobacco products, including cigarettes, chewing tobacco, or electronic cigarettes. If you need help quitting, ask your health care provider.  It is important to eat a healthy diet and maintain a healthy weight. ? Be sure to include plenty of vegetables, fruits, low-fat dairy products, and lean protein. ? Avoid eating foods that are high in solid fats, added sugars, or salt (sodium).  Get regular exercise. This is one of the most important things that you can do for your health. ? Try to exercise for at least 150 minutes each week. The type of exercise that you do should increase your heart rate and make you sweat. This is known as moderate-intensity exercise. ? Try to do strengthening exercises at least twice each week. Do these in addition to the moderate-intensity exercise.  Know your numbers.Ask your health care provider to check your cholesterol and your blood glucose. Continue to have your blood tested as directed by your health care provider.  What should I know about cancer screening? There are several types of cancer. Take the following steps to reduce your risk and  to catch any cancer development as early as possible. Breast Cancer  Practice breast self-awareness. ? This means understanding how your breasts normally appear and feel. ? It also means doing regular breast self-exams. Let your health care provider know about any changes,  no matter how small.  If you are 7 or older, have a clinician do a breast exam (clinical breast exam or CBE) every year. Depending on your age, family history, and medical history, it may be recommended that you also have a yearly breast X-ray (mammogram).  If you have a family history of breast cancer, talk with your health care provider about genetic screening.  If you are at high risk for breast cancer, talk with your health care provider about having an MRI and a mammogram every year.  Breast cancer (BRCA) gene test is recommended for women who have family members with BRCA-related cancers. Results of the assessment will determine the need for genetic counseling and BRCA1 and for BRCA2 testing. BRCA-related cancers include these types: ? Breast. This occurs in males or females. ? Ovarian. ? Tubal. This may also be called fallopian tube cancer. ? Cancer of the abdominal or pelvic lining (peritoneal cancer). ? Prostate. ? Pancreatic.  Cervical, Uterine, and Ovarian Cancer Your health care provider may recommend that you be screened regularly for cancer of the pelvic organs. These include your ovaries, uterus, and vagina. This screening involves a pelvic exam, which includes checking for microscopic changes to the surface of your cervix (Pap test).  For women ages 21-65, health care providers may recommend a pelvic exam and a Pap test every three years. For women ages 73-65, they may recommend the Pap test and pelvic exam, combined with testing for human papilloma virus (HPV), every five years. Some types of HPV increase your risk of cervical cancer. Testing for HPV may also be done on women of any age who have unclear Pap test results.  Other health care providers may not recommend any screening for nonpregnant women who are considered low risk for pelvic cancer and have no symptoms. Ask your health care provider if a screening pelvic exam is right for you.  If you have had past treatment  for cervical cancer or a condition that could lead to cancer, you need Pap tests and screening for cancer for at least 20 years after your treatment. If Pap tests have been discontinued for you, your risk factors (such as having a new sexual partner) need to be reassessed to determine if you should start having screenings again. Some women have medical problems that increase the chance of getting cervical cancer. In these cases, your health care provider may recommend that you have screening and Pap tests more often.  If you have a family history of uterine cancer or ovarian cancer, talk with your health care provider about genetic screening.  If you have vaginal bleeding after reaching menopause, tell your health care provider.  There are currently no reliable tests available to screen for ovarian cancer.  Lung Cancer Lung cancer screening is recommended for adults 52-50 years old who are at high risk for lung cancer because of a history of smoking. A yearly low-dose CT scan of the lungs is recommended if you:  Currently smoke.  Have a history of at least 30 pack-years of smoking and you currently smoke or have quit within the past 15 years. A pack-year is smoking an average of one pack of cigarettes per day for one year.  Yearly screening should:  Continue until it has been 15 years since you quit.  Stop if you develop a health problem that would prevent you from having lung cancer treatment.  Colorectal Cancer  This type of cancer can be detected and can often be prevented.  Routine colorectal cancer screening usually begins at age 79 and continues through age 38.  If you have risk factors for colon cancer, your health care provider may recommend that you be screened at an earlier age.  If you have a family history of colorectal cancer, talk with your health care provider about genetic screening.  Your health care provider may also recommend using home test kits to check for hidden  blood in your stool.  A small camera at the end of a tube can be used to examine your colon directly (sigmoidoscopy or colonoscopy). This is done to check for the earliest forms of colorectal cancer.  Direct examination of the colon should be repeated every 5-10 years until age 15. However, if early forms of precancerous polyps or small growths are found or if you have a family history or genetic risk for colorectal cancer, you may need to be screened more often.  Skin Cancer  Check your skin from head to toe regularly.  Monitor any moles. Be sure to tell your health care provider: ? About any new moles or changes in moles, especially if there is a change in a mole's shape or color. ? If you have a mole that is larger than the size of a pencil eraser.  If any of your family members has a history of skin cancer, especially at a young age, talk with your health care provider about genetic screening.  Always use sunscreen. Apply sunscreen liberally and repeatedly throughout the day.  Whenever you are outside, protect yourself by wearing long sleeves, pants, a wide-brimmed hat, and sunglasses.  What should I know about osteoporosis? Osteoporosis is a condition in which bone destruction happens more quickly than new bone creation. After menopause, you may be at an increased risk for osteoporosis. To help prevent osteoporosis or the bone fractures that can happen because of osteoporosis, the following is recommended:  If you are 42-2 years old, get at least 1,000 mg of calcium and at least 600 mg of vitamin D per day.  If you are older than age 22 but younger than age 58, get at least 1,200 mg of calcium and at least 600 mg of vitamin D per day.  If you are older than age 66, get at least 1,200 mg of calcium and at least 800 mg of vitamin D per day.  Smoking and excessive alcohol intake increase the risk of osteoporosis. Eat foods that are rich in calcium and vitamin D, and do weight-bearing  exercises several times each week as directed by your health care provider. What should I know about how menopause affects my mental health? Depression may occur at any age, but it is more common as you become older. Common symptoms of depression include:  Low or sad mood.  Changes in sleep patterns.  Changes in appetite or eating patterns.  Feeling an overall lack of motivation or enjoyment of activities that you previously enjoyed.  Frequent crying spells.  Talk with your health care provider if you think that you are experiencing depression. What should I know about immunizations? It is important that you get and maintain your immunizations. These include:  Tetanus, diphtheria, and pertussis (Tdap) booster vaccine.  Influenza every year before the  flu season begins.  Pneumonia vaccine.  Shingles vaccine.  Your health care provider may also recommend other immunizations. This information is not intended to replace advice given to you by your health care provider. Make sure you discuss any questions you have with your health care provider. Document Released: 05/20/2005 Document Revised: 10/16/2015 Document Reviewed: 12/30/2014 Elsevier Interactive Patient Education  2018 [employer].

## 2018-03-16 NOTE — Progress Notes (Signed)
Subjective:    Patient ID: Tammy Chang, female    DOB: 1961-02-21, 57 y.o.   MRN: [id number]  CC: Tammy Chang is a 57 y.o. female who presents today for physical exam.    HPI: Here today, overall feels well.  She is been working on her weight and has lost approximately 7 pounds through diet, exercise.    Denies night sweats, unintentional weight loss, bone pain.  No depression, anxiety.  Some fatigue.Sleeps well.  Does complain of  decreased libido.  She cares very much about her husband, just does not have the time   Some vaginal dryness which she uses lubricant with resolve.  No vaginal pain or bleeding.  No h/o seizures.    Colorectal Cancer Screening: UTD , 10 year repeat Breast Cancer Screening: Mammogram UTD. Declines CBE  Cervical Cancer Screening: complete hysterectomy for non cancerous reason. No h/o abnormal pap smear  Bone Health screening/DEXA for 65+: No increased fracture risk. Defer screening at this time. Lung Cancer Screening: Doesn't have 30 year pack year history and age > 55 years.       Tetanus - utd         Labs: Screening labs done prior.  Exercise: Gets regular exercise.  Alcohol use: none Smoking/tobacco use: Nonsmoker.    HISTORY:  Past Medical History:  Diagnosis Date  . Lump or mass in breast 2011   LEFT  . Osteopenia    DEXA 02/2015   . Vitamin D deficiency     Past Surgical History:  Procedure Laterality Date  . ABDOMINAL HYSTERECTOMY  2000  . BREAST BIOPSY Left    negative 02/25/2010  . BREAST EXCISIONAL BIOPSY Left 2011   negative 03/29/2010  . CESAREAN SECTION  [id number]  . COLONOSCOPY WITH PROPOFOL N/A 07/03/2017   Procedure: COLONOSCOPY WITH PROPOFOL;  Surgeon: Wyline MoodAnna, Kiran, MD;  Location: [facility] END[facility]COPY;  Service: Gastroenterology;  Laterality: N/A;  . OOPHORECTOMY     one ovary removed   . TONSILLECTOMY  2009   Family History  Problem Relation Age of Onset  . Heart disease Mother        chf  . Hypertension  Mother   . Kidney disease Mother   . Thyroid disease Mother        had thyroid removed 2/2 goiter   . Hyperlipidemia Mother   . Alcohol abuse Father   . Diabetes Father   . Cirrhosis Father   . Breast cancer Maternal Aunt       ALLERGIES: Morphine and related  Current Outpatient Medications on File Prior to Visit  Medication Sig Dispense Refill  . b complex vitamins capsule Take 1 capsule by mouth daily.    . Cholecalciferol (VITAMIN D3) 2000 units TABS Take by mouth daily.     No current facility-administered medications on file prior to visit.     Social History   Tobacco Use  . Smoking status: Never Smoker  . Smokeless tobacco: Never Used  Substance Use Topics  . Alcohol use: No  . Drug use: No    Review of Systems  Constitutional: Positive for fatigue. Negative for appetite change, chills, fever and unexpected weight change.  HENT: Negative for congestion.   Respiratory: Negative for cough.   Cardiovascular: Negative for chest pain, palpitations and leg swelling.  Gastrointestinal: Negative for nausea and vomiting.  Genitourinary: Negative for pelvic pain, vaginal bleeding, vaginal discharge and vaginal pain.  Musculoskeletal: Negative for arthralgias and myalgias.  Skin: Negative for rash.  Neurological: Negative for seizures and headaches.  Hematological: Negative for adenopathy.  Psychiatric/Behavioral: Negative for confusion, sleep disturbance and suicidal ideas.      Objective:    BP 98/72 (BP Location: Left Arm, Patient Position: Sitting, Cuff Size: Normal)   Pulse 78   Temp 97.9 F (36.6 C)   Wt 140 lb 6.4 oz (63.7 kg)   SpO2 97%   BMI 24.10 kg/m   BP Readings from Last 3 Encounters:  03/16/18 98/72  07/03/17 93/75  03/13/17 98/70   Wt Readings from Last 3 Encounters:  03/16/18 140 lb 6.4 oz (63.7 kg)  07/03/17 147 lb (66.7 kg)  03/13/17 147 lb 4 oz (66.8 kg)    Physical Exam  Constitutional: She appears well-developed and  well-nourished.  Eyes: Conjunctivae are normal.  Neck: No thyroid mass and no thyromegaly present.  Cardiovascular: Normal rate, regular rhythm, normal heart sounds and normal pulses.  Pulmonary/Chest: Effort normal and breath sounds normal. She has no wheezes. She has no rhonchi. She has no rales.  Lymphadenopathy:       Head (right side): No submental, no submandibular, no tonsillar, no preauricular, no posterior auricular and no occipital adenopathy present.       Head (left side): No submental, no submandibular, no tonsillar, no preauricular, no posterior auricular and no occipital adenopathy present.    She has no cervical adenopathy.  Neurological: She is alert.  Skin: Skin is warm and dry.  Psychiatric: She has a normal mood and affect. Her speech is normal and behavior is normal. Thought content normal.  Vitals reviewed.      Assessment & Plan:   Problem List Items Addressed This Visit      Other   Routine physical examination - Primary    Patient up-to-date on mammogram, colonoscopy.  She politely declines clinical breast exam in the office today.  Encouraged continued exercise.  Congratulated patient on weight loss.      Elevated total protein    Improved. Chronic for 6 years.  Pending urine, serum studies.  I have also sent a message Dr Racheal Patches, to ensure no further work-up will be advised.  Patient will let me know if she is not heard from me regarding this.  Emphasized several times during our office visit.      Relevant Orders   IFE AND PE, RANDOM URINE   Protein electrophoresis, serum   Other fatigue    Trial Wellbutrin for fatigue, decreased libido.  Follow-up in 3 to 6 months.      Relevant Medications   buPROPion (WELLBUTRIN XL) 150 MG 24 hr tablet       I have discontinued Joelene Millin. Capili's fluticasone, benzonatate, and ciprofloxacin. I am also having her start on buPROPion. Additionally, I am having her maintain her Vitamin D3 and b complex  vitamins.   Meds ordered this encounter  Medications  . buPROPion (WELLBUTRIN XL) 150 MG 24 hr tablet    Sig: Start 150 mg ER PO qam, increase after 3 days to 300 mg qam.    Dispense:  60 tablet    Refill:  3    Order Specific Question:   Supervising Provider    Answer:   Sherlene Shams [[id number]]    Return precautions given.   Risks, benefits, and alternatives of the medications and treatment plan prescribed today were discussed, and patient expressed understanding.   Education regarding symptom management and diagnosis given to patient on AVS.   Continue to follow with Ronee Ranganathan,  Lyn Records, FNP for routine health maintenance.   Tammy Busing and I agreed with plan.   Rennie Plowman, FNP

## 2018-03-19 LAB — IFE AND PE, RANDOM URINE
% BETA, URINE: 0 %
ALBUMIN, U: 0 %
ALPHA 1 URINE: 0 %
ALPHA-2-GLOBULIN, U: 0 %
GAMMA GLOBULIN URINE: 0 %

## 2018-03-19 LAB — PROTEIN ELECTROPHORESIS, SERUM
ALBUMIN ELP: 5.1 g/dL — AB (ref 3.8–4.8)
ALPHA 1: 0.3 g/dL (ref 0.2–0.3)
ALPHA 2: 0.8 g/dL (ref 0.5–0.9)
BETA 2: 0.5 g/dL (ref 0.2–0.5)
BETA GLOBULIN: 0.5 g/dL (ref 0.4–0.6)
GAMMA GLOBULIN: 1.4 g/dL (ref 0.8–1.7)
Total Protein: 8.7 g/dL — ABNORMAL HIGH (ref 6.1–8.1)

## 2018-06-18 ENCOUNTER — Ambulatory Visit (INDEPENDENT_AMBULATORY_CARE_PROVIDER_SITE_OTHER): Payer: No Typology Code available for payment source | Admitting: Family

## 2018-06-18 ENCOUNTER — Encounter: Payer: Self-pay | Admitting: Family

## 2018-06-18 DIAGNOSIS — R5383 Other fatigue: Secondary | ICD-10-CM | POA: Diagnosis not present

## 2018-06-18 NOTE — Patient Instructions (Signed)
Nice to see you!   

## 2018-06-18 NOTE — Assessment & Plan Note (Signed)
Energy has improved.  We will continue Wellbutrin.  Will follow

## 2018-06-18 NOTE — Progress Notes (Signed)
Subjective:    Patient ID: Tammy Chang, female    DOB: 1960-11-21, 58 y.o.   MRN: [id number]  CC: Tammy Chang is a 58 y.o. female who presents today for follow up.   HPI: Feels well today, no concerns.  Here today for follow-up of Wellbutrin.  Her energy has improved, no real improvement of libido.    she feels well the medication would like to stay on medication.  No trouble sleeping, increased anxiety.  She feels that she has more energy to be around her grandchildren.      HISTORY:  Past Medical History:  Diagnosis Date  . Lump or mass in breast 2011   LEFT  . Osteopenia    DEXA 02/2015   . Vitamin D deficiency    Past Surgical History:  Procedure Laterality Date  . ABDOMINAL HYSTERECTOMY  2000  . BREAST BIOPSY Left    negative 02/25/2010  . BREAST EXCISIONAL BIOPSY Left 2011   negative 03/29/2010  . CESAREAN SECTION  [id number]  . COLONOSCOPY WITH PROPOFOL N/A 07/03/2017   Procedure: COLONOSCOPY WITH PROPOFOL;  Surgeon: Wyline Mood, MD;  Location: [facility] ENDOSCOPY;  Service: Gastroenterology;  Laterality: N/A;  . OOPHORECTOMY     one ovary removed   . TONSILLECTOMY  2009   Family History  Problem Relation Age of Onset  . Heart disease Mother        chf  . Hypertension Mother   . Kidney disease Mother   . Thyroid disease Mother        had thyroid removed 2/2 goiter   . Hyperlipidemia Mother   . Alcohol abuse Father   . Diabetes Father   . Cirrhosis Father   . Breast cancer Maternal Aunt     Allergies: Morphine and related Current Outpatient Medications on File Prior to Visit  Medication Sig Dispense Refill  . b complex vitamins capsule Take 1 capsule by mouth daily.    Marland Kitchen buPROPion (WELLBUTRIN XL) 150 MG 24 hr tablet Start 150 mg ER PO qam, increase after 3 days to 300 mg qam. 60 tablet 3  . Cholecalciferol (VITAMIN D3) 2000 units TABS Take by mouth daily.     No current facility-administered medications on file prior to visit.     Social History     Tobacco Use  . Smoking status: Never Smoker  . Smokeless tobacco: Never Used  Substance Use Topics  . Alcohol use: No  . Drug use: No    Review of Systems  Constitutional: Negative for chills and fever.  Respiratory: Negative for cough.   Cardiovascular: Negative for chest pain and palpitations.  Gastrointestinal: Negative for nausea and vomiting.      Objective:    BP 100/64 (BP Location: Left Arm, Patient Position: Sitting, Cuff Size: Large)   Pulse 86   Temp 97.8 F (36.6 C)   Wt 141 lb 9.6 oz (64.2 kg)   SpO2 96%   BMI 24.31 kg/m  BP Readings from Last 3 Encounters:  06/18/18 100/64  03/16/18 98/72  07/03/17 93/75   Wt Readings from Last 3 Encounters:  06/18/18 141 lb 9.6 oz (64.2 kg)  03/16/18 140 lb 6.4 oz (63.7 kg)  07/03/17 147 lb (66.7 kg)    Physical Exam Vitals signs reviewed.  Constitutional:      Appearance: She is well-developed.  Eyes:     Conjunctiva/sclera: Conjunctivae normal.  Cardiovascular:     Rate and Rhythm: Normal rate and regular rhythm.  Pulses: Normal pulses.     Heart sounds: Normal heart sounds.  Pulmonary:     Effort: Pulmonary effort is normal.     Breath sounds: Normal breath sounds. No wheezing, rhonchi or rales.  Skin:    General: Skin is warm and dry.  Neurological:     Mental Status: She is alert.  Psychiatric:        Speech: Speech normal.        Behavior: Behavior normal.        Thought Content: Thought content normal.        Assessment & Plan:   Problem List Items Addressed This Visit      Other   Other fatigue    Energy has improved.  We will continue Wellbutrin.  Will follow          I am having Tammy Chang maintain her Vitamin D3, b complex vitamins, and buPROPion.   No orders of the defined types were placed in this encounter.   Return precautions given.   Risks, benefits, and alternatives of the medications and treatment plan prescribed today were discussed, and patient expressed  understanding.   Education regarding symptom management and diagnosis given to patient on AVS.  Continue to follow with Allegra Grana, FNP for routine health maintenance.   Chanda Busing and I agreed with plan.   Rennie Plowman, FNP

## 2018-10-03 ENCOUNTER — Other Ambulatory Visit: Payer: Self-pay | Admitting: Family

## 2018-10-03 DIAGNOSIS — R5383 Other fatigue: Secondary | ICD-10-CM

## 2019-02-21 ENCOUNTER — Other Ambulatory Visit: Payer: Self-pay | Admitting: Family

## 2019-02-21 DIAGNOSIS — Z1231 Encounter for screening mammogram for malignant neoplasm of breast: Secondary | ICD-10-CM

## 2019-03-06 ENCOUNTER — Ambulatory Visit
Admission: RE | Admit: 2019-03-06 | Discharge: 2019-03-06 | Disposition: A | Discharge status: Home / Self Care | Payer: No Typology Code available for payment source | Source: Ambulatory Visit | Attending: Family | Admitting: Family

## 2019-03-06 ENCOUNTER — Other Ambulatory Visit: Payer: Self-pay

## 2019-03-06 DIAGNOSIS — Z1231 Encounter for screening mammogram for malignant neoplasm of breast: Secondary | ICD-10-CM | POA: Insufficient documentation

## 2019-03-12 DEATH — deceased

## 2019-03-20 ENCOUNTER — Ambulatory Visit: Payer: Self-pay | Admitting: Family

## 2019-03-25 ENCOUNTER — Encounter: Payer: Self-pay | Admitting: Family

## 2019-03-25 ENCOUNTER — Ambulatory Visit (INDEPENDENT_AMBULATORY_CARE_PROVIDER_SITE_OTHER): Payer: No Typology Code available for payment source | Admitting: Family

## 2019-03-25 ENCOUNTER — Telehealth: Payer: Self-pay | Admitting: Family

## 2019-03-25 VITALS — BP 119/77 | HR 79 | Ht 64.0 in | Wt 149.0 lb

## 2019-03-25 DIAGNOSIS — Z0000 Encounter for general adult medical examination without abnormal findings: Secondary | ICD-10-CM

## 2019-03-25 NOTE — Patient Instructions (Signed)
Please do labs at [facility] as we discussed. As also discussed, when you were in the office , we will need to do a pelvic exam to be sure you do not have a cervix, as in that case you would need a Pap smear. Stay safe!

## 2019-03-25 NOTE — Progress Notes (Signed)
Virtual Visit via Video Note  I connected with@  on 03/25/19 at  2:30 PM EST by a video enabled telemedicine application and verified that I am speaking with the correct person using two identifiers.  Location patient: home Location provider: home office Persons participating in the virtual visit: patient, provider  I discussed the limitations of evaluation and management by telemedicine and the availability of in person appointments. The patient expressed understanding and agreed to proceed.  Interactive audio and video telecommunications were attempted between this provider and patient, however failed, due to patient having technical difficulties or patient did not have access to video capability.  We continued and completed visit with audio only.    HPI: Feels well, no complaints. Sleeping well. Not depressed.   Has gained weight but not getting much exercise. Plans to start a walking program. No CP, SOB.   Mammogram UTD, colonoscopy UTD ( 2019) and repeat in 10 years.   H/o hysterectomy in 2000 for non cancerous reasons  ROS: See pertinent positives and negatives per HPI.  Past Medical History:  Diagnosis Date  . Lump or mass in breast 2011   LEFT  . Osteopenia    DEXA 02/2015   . Vitamin D deficiency     Past Surgical History:  Procedure Laterality Date  . ABDOMINAL HYSTERECTOMY  2000   believes one has ovary.   Marland Kitchen BREAST BIOPSY Left    negative 02/25/2010  . BREAST EXCISIONAL BIOPSY Left 2011   negative 03/29/2010  . CESAREAN SECTION  [id number]  . COLONOSCOPY WITH PROPOFOL N/A 07/03/2017   Procedure: COLONOSCOPY WITH PROPOFOL;  Surgeon: Jonathon Bellows, MD;  Location: [facility] ENDOSCOPY;  Service: Gastroenterology;  Laterality: N/A;  . OOPHORECTOMY     one ovary removed   . TONSILLECTOMY  2009    Family History  Problem Relation Age of Onset  . Heart disease Mother        chf  . Hypertension Mother   . Kidney disease Mother   . Thyroid disease Mother        had  thyroid removed 2/2 goiter   . Hyperlipidemia Mother   . Alcohol abuse Father   . Diabetes Father   . Cirrhosis Father   . Breast cancer Maternal Aunt     SOCIAL HX: never smoker   Current Outpatient Medications:  .  b complex vitamins capsule, Take 1 capsule by mouth daily., Disp: , Rfl:  .  Cholecalciferol (VITAMIN D3) 2000 units TABS, Take by mouth daily., Disp: , Rfl:   EXAM:  VITALS per patient if applicable: Wt Readings from Last 3 Encounters:  03/25/19 149 lb (67.6 kg)  06/18/18 141 lb 9.6 oz (64.2 kg)  03/16/18 140 lb 6.4 oz (63.7 kg)   Today's Vitals   03/25/19 1431  BP: 119/77  Pulse: 79  SpO2: 96%  Weight: 149 lb (67.6 kg)  Height: 5\' 4"  (1.626 m)   Body mass index is 25.58 kg/m.   GENERAL: alert, oriented, appears well and in no acute distress  HEENT: atraumatic, conjunttiva clear, no obvious abnormalities on inspection of external nose and ears  NECK: normal movements of the head and neck  LUNGS: on inspection no signs of respiratory distress, breathing rate appears normal, no obvious gross SOB, gasping or wheezing  CV: no obvious cyanosis  MS: moves all visible extremities without noticeable abnormality  PSYCH/NEURO: pleasant and cooperative, no obvious depression or anxiety, speech and thought processing grossly intact  ASSESSMENT AND PLAN:  Discussed the  following assessment and plan:  Routine physical examination - Plan: CBC with Differential/Platelet, Comprehensive metabolic panel-FUTURE, Hemoglobin A1c, Lipid panel-future, VITAMIN D 25 Hydroxy (Vit-D Deficiency, Fractures), TSH-future, B12 and Folate Panel-macrocytic anemia  Problem List Items Addressed This Visit      Other   Routine physical examination - Primary    Virtual CPE performed.  Advised patient to do clinical breast exams at home.  She is up-to-date on mammogram, colonoscopy.  She will have CPE labs done.  We also had a long discussion regards to her history of a hysterectomy  as patient is unsure if she has a cervix or not.  Advised her we should do pelvic exam when she is in the office to ensure she does not require Pap smear.  Patient verbalized understanding of all      Relevant Orders   CBC with Differential/Platelet   Comprehensive metabolic panel-FUTURE   Hemoglobin A1c   Lipid panel-future   VITAMIN D 25 Hydroxy (Vit-D Deficiency, Fractures)   TSH-future   B12 and Folate Panel-macrocytic anemia      -we discussed possible serious and likely etiologies, options for evaluation and workup, limitations of telemedicine visit vs in person visit, treatment, treatment risks and precautions. Pt prefers to treat via telemedicine empirically rather then risking or undertaking an in person visit at this moment. Patient agrees to seek prompt in person care if worsening, new symptoms arise, or if is not improving with treatment.   I discussed the assessment and treatment plan with the patient. The patient was provided an opportunity to ask questions and all were answered. The patient agreed with the plan and demonstrated an understanding of the instructions.   The patient was advised to call back or seek an in-person evaluation if the symptoms worsen or if the condition fails to improve as anticipated.   Rennie Plowman, FNP

## 2019-03-25 NOTE — Telephone Encounter (Signed)
Call pt Tell her there is not a box to select [facility]; I have simply ordered as I have in the past. Please ask her to pop into the lab or call them to see if they are able to see since under the [facility] [employer]; you may also offer to print them of course and she can take them with her.

## 2019-03-25 NOTE — Assessment & Plan Note (Signed)
Virtual CPE performed.  Advised patient to do clinical breast exams at home.  She is up-to-date on mammogram, colonoscopy.  She will have CPE labs done.  We also had a long discussion regards to her history of a hysterectomy as patient is unsure if she has a cervix or not.  Advised her we should do pelvic exam when she is in the office to ensure she does not require Pap smear.  Patient verbalized understanding of all

## 2019-03-25 NOTE — Telephone Encounter (Signed)
Called patient and set up follow up. Patient wants to make sure that her labs are ordered for her to go to [facility].

## 2019-03-26 NOTE — Telephone Encounter (Signed)
I left detailed VM for patient asking that she call [facility] to make sure they could see lab orders. If not then to let us know & could fax them requisitions. I stated that I thought that they could see them since they were ordered under [facility].

## 2019-03-28 ENCOUNTER — Other Ambulatory Visit
Admission: RE | Admit: 2019-03-28 | Discharge: 2019-03-28 | Disposition: A | Discharge status: Home / Self Care | Payer: No Typology Code available for payment source | Attending: Family | Admitting: Family

## 2019-03-28 ENCOUNTER — Other Ambulatory Visit: Payer: Self-pay

## 2019-03-28 DIAGNOSIS — Z0000 Encounter for general adult medical examination without abnormal findings: Secondary | ICD-10-CM | POA: Diagnosis not present

## 2019-03-28 LAB — VITAMIN D 25 HYDROXY (VIT D DEFICIENCY, FRACTURES): Vit D, 25-Hydroxy: 26.55 ng/mL — ABNORMAL LOW (ref 30–100)

## 2019-03-28 LAB — CBC WITH DIFFERENTIAL/PLATELET
Abs Immature Granulocytes: 0.02 10*3/uL (ref 0.00–0.07)
Basophils Absolute: 0 10*3/uL (ref 0.0–0.1)
Basophils Relative: 1 %
Eosinophils Absolute: 0.2 10*3/uL (ref 0.0–0.5)
Eosinophils Relative: 3 %
HCT: 39.3 % (ref 36.0–46.0)
Hemoglobin: 13.2 g/dL (ref 12.0–15.0)
Immature Granulocytes: 0 %
Lymphocytes Relative: 33 %
Lymphs Abs: 2.5 10*3/uL (ref 0.7–4.0)
MCH: 29.9 pg (ref 26.0–34.0)
MCHC: 33.6 g/dL (ref 30.0–36.0)
MCV: 88.9 fL (ref 80.0–100.0)
Monocytes Absolute: 0.4 10*3/uL (ref 0.1–1.0)
Monocytes Relative: 6 %
Neutro Abs: 4.3 10*3/uL (ref 1.7–7.7)
Neutrophils Relative %: 57 %
Platelets: 285 10*3/uL (ref 150–400)
RBC: 4.42 MIL/uL (ref 3.87–5.11)
RDW: 12.3 % (ref 11.5–15.5)
WBC: 7.5 10*3/uL (ref 4.0–10.5)
nRBC: 0 % (ref 0.0–0.2)

## 2019-03-28 LAB — COMPREHENSIVE METABOLIC PANEL
ALT: 21 U/L (ref 0–44)
AST: 23 U/L (ref 15–41)
Albumin: 4.6 g/dL (ref 3.5–5.0)
Alkaline Phosphatase: 74 U/L (ref 38–126)
Anion gap: 10 (ref 5–15)
BUN: 16 mg/dL (ref 6–20)
CO2: 24 mmol/L (ref 22–32)
Calcium: 9.3 mg/dL (ref 8.9–10.3)
Chloride: 103 mmol/L (ref 98–111)
Creatinine, Ser: 0.79 mg/dL (ref 0.44–1.00)
GFR calc Af Amer: >60 mL/min (ref >60)
GFR calc non Af Amer: >60 mL/min (ref >60)
Glucose, Bld: 90 mg/dL (ref 70–99)
Potassium: 4.3 mmol/L (ref 3.5–5.1)
Sodium: 137 mmol/L (ref 135–145)
Total Bilirubin: 0.6 mg/dL (ref 0.3–1.2)
Total Protein: 8.2 g/dL — ABNORMAL HIGH (ref 6.5–8.1)

## 2019-03-28 LAB — LIPID PANEL
Cholesterol: 239 mg/dL — ABNORMAL HIGH (ref 0–200)
HDL: 46 mg/dL (ref >40)
LDL Cholesterol: 153 mg/dL — ABNORMAL HIGH (ref 0–99)
Total CHOL/HDL Ratio: 5.2 RATIO
Triglycerides: 201 mg/dL — ABNORMAL HIGH (ref <150)
VLDL: 40 mg/dL (ref 0–40)

## 2019-03-28 LAB — TSH: TSH: 1.268 u[IU]/mL (ref 0.350–4.500)

## 2019-03-28 LAB — HEMOGLOBIN A1C
Hgb A1c MFr Bld: 6 % — ABNORMAL HIGH (ref 4.8–5.6)
Mean Plasma Glucose: 125.5 mg/dL

## 2019-03-28 LAB — FOLATE: Folate: 18.9 ng/mL (ref >5.9)

## 2019-03-28 LAB — VITAMIN B12: Vitamin B-12: 250 pg/mL (ref 180–914)

## 2019-03-28 NOTE — Addendum Note (Signed)
Addended by: Eeva Schlosser V. on: 03/28/2019 08:19 AM   Modules accepted: Orders  

## 2019-03-28 NOTE — Addendum Note (Signed)
Addended by: Bard Haupert V. on: 03/28/2019 08:19 AM   Modules accepted: Orders  

## 2019-03-28 NOTE — Addendum Note (Signed)
Addended by: Jeena Arnett V. on: 03/28/2019 08:19 AM   Modules accepted: Orders  

## 2019-03-28 NOTE — Addendum Note (Signed)
Addended by: Debbera Wolken V. on: 03/28/2019 08:19 AM   Modules accepted: Orders  

## 2019-03-28 NOTE — Addendum Note (Signed)
Addended by: Elmo Putt on: 03/28/2019 08:18 AM   Modules accepted: Orders

## 2019-03-28 NOTE — Addendum Note (Signed)
Addended by: Storey Stangeland V. on: 03/28/2019 08:19 AM   Modules accepted: Orders  

## 2019-03-28 NOTE — Addendum Note (Signed)
Addended by: Elmo Putt on: 03/28/2019 08:19 AM   Modules accepted: Orders

## 2019-04-10 ENCOUNTER — Encounter: Payer: Self-pay | Admitting: Family

## 2019-09-23 ENCOUNTER — Ambulatory Visit: Payer: No Typology Code available for payment source | Admitting: Family

## 2019-11-22 ENCOUNTER — Telehealth: Payer: Self-pay | Admitting: Family

## 2019-11-22 NOTE — Telephone Encounter (Signed)
Patient needs a letter from her provider and proof that she was seen in December 2020 to prove she is still living. Letter must be signed by Jason Coop so she can take with her to [facility] office. Patient has to prove she was seen in December 2020 and was alive at that time.

## 2019-11-26 NOTE — Telephone Encounter (Signed)
done

## 2019-11-26 NOTE — Telephone Encounter (Signed)
Letter has been drafted and sent to you

## 2019-11-26 NOTE — Progress Notes (Signed)
Letter verifying that patient was seen in 03/2019 has been drafted and sent to PCP.

## 2019-11-26 NOTE — Telephone Encounter (Signed)
See below Please draft letter that patient was seen by me for annual physical 03/25/19 so that I can sign

## 2020-02-24 ENCOUNTER — Other Ambulatory Visit: Payer: Self-pay | Admitting: Family

## 2020-02-24 DIAGNOSIS — Z1231 Encounter for screening mammogram for malignant neoplasm of breast: Secondary | ICD-10-CM

## 2020-03-09 ENCOUNTER — Ambulatory Visit
Admission: RE | Admit: 2020-03-09 | Discharge: 2020-03-09 | Disposition: A | Discharge status: Home / Self Care | Payer: No Typology Code available for payment source | Source: Ambulatory Visit | Attending: Family | Admitting: Family

## 2020-03-09 ENCOUNTER — Other Ambulatory Visit: Payer: Self-pay

## 2020-03-09 DIAGNOSIS — Z1231 Encounter for screening mammogram for malignant neoplasm of breast: Secondary | ICD-10-CM | POA: Diagnosis present

## 2020-03-10 ENCOUNTER — Other Ambulatory Visit: Payer: Self-pay | Admitting: Family

## 2020-03-10 DIAGNOSIS — N6320 Unspecified lump in the left breast, unspecified quadrant: Secondary | ICD-10-CM

## 2020-03-10 DIAGNOSIS — R928 Other abnormal and inconclusive findings on diagnostic imaging of breast: Secondary | ICD-10-CM

## 2020-03-18 ENCOUNTER — Telehealth: Payer: Self-pay

## 2020-03-18 DIAGNOSIS — Z0000 Encounter for general adult medical examination without abnormal findings: Secondary | ICD-10-CM

## 2020-03-18 NOTE — Telephone Encounter (Signed)
Have you ordered labs for [facility] before for patient?

## 2020-03-18 NOTE — Telephone Encounter (Signed)
Pt would like labs to be drawn at [facility] prior to her appt with PCP. Please correct lab orders for that location.

## 2020-03-20 ENCOUNTER — Other Ambulatory Visit: Payer: Self-pay

## 2020-03-20 ENCOUNTER — Ambulatory Visit
Admission: RE | Admit: 2020-03-20 | Discharge: 2020-03-20 | Disposition: A | Discharge status: Home / Self Care | Payer: No Typology Code available for payment source | Source: Ambulatory Visit | Attending: Family | Admitting: Family

## 2020-03-20 DIAGNOSIS — N6320 Unspecified lump in the left breast, unspecified quadrant: Secondary | ICD-10-CM

## 2020-03-20 DIAGNOSIS — R928 Other abnormal and inconclusive findings on diagnostic imaging of breast: Secondary | ICD-10-CM

## 2020-03-23 NOTE — Telephone Encounter (Signed)
I called patient & let her know that labs were ordered. She will let us know if any issue shaving drawn at [facility].

## 2020-03-23 NOTE — Addendum Note (Signed)
Addended by: Allegra Grana on: 03/23/2020 06:40 AM   Modules accepted: Orders

## 2020-03-23 NOTE — Telephone Encounter (Signed)
Call pt I ordered fasting labs however not exactly sure how they come over for [facility].  She may to lab and see if they can see. ottherwise she can do here on day of her CPE

## 2020-03-24 ENCOUNTER — Other Ambulatory Visit
Admission: EM | Admit: 2020-03-24 | Discharge: 2020-03-24 | Disposition: A | Discharge status: Home / Self Care | Payer: No Typology Code available for payment source | Attending: Family | Admitting: Family

## 2020-03-24 DIAGNOSIS — Z0000 Encounter for general adult medical examination without abnormal findings: Secondary | ICD-10-CM | POA: Diagnosis present

## 2020-03-24 LAB — CBC WITH DIFFERENTIAL/PLATELET
Abs Immature Granulocytes: 0.02 10*3/uL (ref 0.00–0.07)
Basophils Absolute: 0.1 10*3/uL (ref 0.0–0.1)
Basophils Relative: 1 %
Eosinophils Absolute: 0.2 10*3/uL (ref 0.0–0.5)
Eosinophils Relative: 2 %
HCT: 40.2 % (ref 36.0–46.0)
Hemoglobin: 13.5 g/dL (ref 12.0–15.0)
Immature Granulocytes: 0 %
Lymphocytes Relative: 28 %
Lymphs Abs: 2.3 10*3/uL (ref 0.7–4.0)
MCH: 29.9 pg (ref 26.0–34.0)
MCHC: 33.6 g/dL (ref 30.0–36.0)
MCV: 89.1 fL (ref 80.0–100.0)
Monocytes Absolute: 0.4 10*3/uL (ref 0.1–1.0)
Monocytes Relative: 5 %
Neutro Abs: 5.3 10*3/uL (ref 1.7–7.7)
Neutrophils Relative %: 64 %
Platelets: 328 10*3/uL (ref 150–400)
RBC: 4.51 MIL/uL (ref 3.87–5.11)
RDW: 12.3 % (ref 11.5–15.5)
WBC: 8.2 10*3/uL (ref 4.0–10.5)
nRBC: 0 % (ref 0.0–0.2)

## 2020-03-24 LAB — COMPREHENSIVE METABOLIC PANEL
ALT: 20 U/L (ref 0–44)
AST: 21 U/L (ref 15–41)
Albumin: 4.3 g/dL (ref 3.5–5.0)
Alkaline Phosphatase: 74 U/L (ref 38–126)
Anion gap: 10 (ref 5–15)
BUN: 19 mg/dL (ref 6–20)
CO2: 26 mmol/L (ref 22–32)
Calcium: 9.4 mg/dL (ref 8.9–10.3)
Chloride: 103 mmol/L (ref 98–111)
Creatinine, Ser: 0.79 mg/dL (ref 0.44–1.00)
GFR, Estimated: >60 mL/min (ref >60)
Glucose, Bld: 96 mg/dL (ref 70–99)
Potassium: 4.4 mmol/L (ref 3.5–5.1)
Sodium: 139 mmol/L (ref 135–145)
Total Bilirubin: 0.3 mg/dL (ref 0.3–1.2)
Total Protein: 7.9 g/dL (ref 6.5–8.1)

## 2020-03-24 LAB — LIPID PANEL
Cholesterol: 222 mg/dL — ABNORMAL HIGH (ref 0–200)
HDL: 46 mg/dL (ref >40)
LDL Cholesterol: 128 mg/dL — ABNORMAL HIGH (ref 0–99)
Total CHOL/HDL Ratio: 4.8 RATIO
Triglycerides: 240 mg/dL — ABNORMAL HIGH (ref <150)
VLDL: 48 mg/dL — ABNORMAL HIGH (ref 0–40)

## 2020-03-24 LAB — TSH: TSH: 1.677 u[IU]/mL (ref 0.350–4.500)

## 2020-03-24 LAB — HEMOGLOBIN A1C
Hgb A1c MFr Bld: 5.7 % — ABNORMAL HIGH (ref 4.8–5.6)
Mean Plasma Glucose: 116.89 mg/dL

## 2020-03-24 LAB — VITAMIN D 25 HYDROXY (VIT D DEFICIENCY, FRACTURES): Vit D, 25-Hydroxy: 48.02 ng/mL (ref 30–100)

## 2020-03-26 ENCOUNTER — Encounter: Payer: Self-pay | Admitting: Family

## 2020-03-27 ENCOUNTER — Other Ambulatory Visit: Payer: Self-pay

## 2020-03-27 ENCOUNTER — Other Ambulatory Visit (HOSPITAL_COMMUNITY)
Admission: RE | Admit: 2020-03-27 | Discharge: 2020-03-27 | Disposition: A | Discharge status: Home / Self Care | Payer: No Typology Code available for payment source | Source: Ambulatory Visit | Attending: Family | Admitting: Family

## 2020-03-27 ENCOUNTER — Ambulatory Visit (INDEPENDENT_AMBULATORY_CARE_PROVIDER_SITE_OTHER): Payer: No Typology Code available for payment source | Admitting: Family

## 2020-03-27 ENCOUNTER — Encounter: Payer: Self-pay | Admitting: Family

## 2020-03-27 VITALS — BP 122/82 | HR 84 | Temp 98.5°F | Ht 64.0 in | Wt 153.0 lb

## 2020-03-27 DIAGNOSIS — Z0000 Encounter for general adult medical examination without abnormal findings: Secondary | ICD-10-CM

## 2020-03-27 NOTE — Patient Instructions (Signed)
Nice to see you!   Health Maintenance for Postmenopausal Women Menopause is a normal process in which your ability to get pregnant comes to an end. This process happens slowly over many months or years, usually between the ages of 48 and 55. Menopause is complete when you have missed your menstrual periods for 12 months. It is important to talk with your health care provider about some of the most common conditions that affect women after menopause (postmenopausal women). These include heart disease, cancer, and bone loss (osteoporosis). Adopting a healthy lifestyle and getting preventive care can help to promote your health and wellness. The actions you take can also lower your chances of developing some of these common conditions. What should I know about menopause? During menopause, you may get a number of symptoms, such as:  Hot flashes. These can be moderate or severe.  Night sweats.  Decrease in sex drive.  Mood swings.  Headaches.  Tiredness.  Irritability.  Memory problems.  Insomnia. Choosing to treat or not to treat these symptoms is a decision that you make with your health care provider. Do I need hormone replacement therapy?  Hormone replacement therapy is effective in treating symptoms that are caused by menopause, such as hot flashes and night sweats.  Hormone replacement carries certain risks, especially as you become older. If you are thinking about using estrogen or estrogen with progestin, discuss the benefits and risks with your health care provider. What is my risk for heart disease and stroke? The risk of heart disease, heart attack, and stroke increases as you age. One of the causes may be a change in the body's hormones during menopause. This can affect how your body uses dietary fats, triglycerides, and cholesterol. Heart attack and stroke are medical emergencies. There are many things that you can do to help prevent heart disease and stroke. Watch your  blood pressure  High blood pressure causes heart disease and increases the risk of stroke. This is more likely to develop in people who have high blood pressure readings, are of African descent, or are overweight.  Have your blood pressure checked: ? Every 3-5 years if you are 18-39 years of age. ? Every year if you are 40 years old or older. Eat a healthy diet   Eat a diet that includes plenty of vegetables, fruits, low-fat dairy products, and lean protein.  Do not eat a lot of foods that are high in solid fats, added sugars, or sodium. Get regular exercise Get regular exercise. This is one of the most important things you can do for your health. Most adults should:  Try to exercise for at least 150 minutes each week. The exercise should increase your heart rate and make you sweat (moderate-intensity exercise).  Try to do strengthening exercises at least twice each week. Do these in addition to the moderate-intensity exercise.  Spend less time sitting. Even light physical activity can be beneficial. Other tips  Work with your health care provider to achieve or maintain a healthy weight.  Do not use any products that contain nicotine or tobacco, such as cigarettes, e-cigarettes, and chewing tobacco. If you need help quitting, ask your health care provider.  Know your numbers. Ask your health care provider to check your cholesterol and your blood sugar (glucose). Continue to have your blood tested as directed by your health care provider. Do I need screening for cancer? Depending on your health history and family history, you may need to have cancer screening at   different stages of your life. This may include screening for:  Breast cancer.  Cervical cancer.  Lung cancer.  Colorectal cancer. What is my risk for osteoporosis? After menopause, you may be at increased risk for osteoporosis. Osteoporosis is a condition in which bone destruction happens more quickly than new bone  creation. To help prevent osteoporosis or the bone fractures that can happen because of osteoporosis, you may take the following actions:  If you are 19-50 years old, get at least 1,000 mg of calcium and at least 600 mg of vitamin D per day.  If you are older than age 50 but younger than age 70, get at least 1,200 mg of calcium and at least 600 mg of vitamin D per day.  If you are older than age 70, get at least 1,200 mg of calcium and at least 800 mg of vitamin D per day. Smoking and drinking excessive alcohol increase the risk of osteoporosis. Eat foods that are rich in calcium and vitamin D, and do weight-bearing exercises several times each week as directed by your health care provider. How does menopause affect my mental health? Depression may occur at any age, but it is more common as you become older. Common symptoms of depression include:  Low or sad mood.  Changes in sleep patterns.  Changes in appetite or eating patterns.  Feeling an overall lack of motivation or enjoyment of activities that you previously enjoyed.  Frequent crying spells. Talk with your health care provider if you think that you are experiencing depression. General instructions See your health care provider for regular wellness exams and vaccines. This may include:  Scheduling regular health, dental, and eye exams.  Getting and maintaining your vaccines. These include: ? Influenza vaccine. Get this vaccine each year before the flu season begins. ? Pneumonia vaccine. ? Shingles vaccine. ? Tetanus, diphtheria, and pertussis (Tdap) booster vaccine. Your health care provider may also recommend other immunizations. Tell your health care provider if you have ever been abused or do not feel safe at home. Summary  Menopause is a normal process in which your ability to get pregnant comes to an end.  This condition causes hot flashes, night sweats, decreased interest in sex, mood swings, headaches, or lack of  sleep.  Treatment for this condition may include hormone replacement therapy.  Take actions to keep yourself healthy, including exercising regularly, eating a healthy diet, watching your weight, and checking your blood pressure and blood sugar levels.  Get screened for cancer and depression. Make sure that you are up to date with all your vaccines. This information is not intended to replace advice given to you by your health care provider. Make sure you discuss any questions you have with your health care provider. Document Revised: 03/21/2018 Document Reviewed: 03/21/2018 Elsevier Patient Education  2020 Elsevier Inc.  

## 2020-03-27 NOTE — Progress Notes (Signed)
Subjective:    Patient ID: Tammy Chang, female    DOB: 05-19-1960, 59 y.o.   MRN: [id number]  CC: Tammy Chang is a 59 y.o. female who presents today for physical exam.    HPI: Feels well today She would 'baseline' ekg. She has never had EKG. No cp, sob, exercise intolerance.       Colorectal Cancer Screening: UTD , repeat in 10 years Breast Cancer Screening: Mammogram UTD repeat 03/20/20, bi rds category 1 Cervical Cancer Screening: h/o hysterectomy. Unsure if has cervix, has one ovary.No abdominal distention, pelvic pain. She had pap smears since her hysterectomy and was told by last GYN Dr Toya Smothers 5 years ago to stop at age 34.   Bone Health screening/DEXA for 65+: No increased fracture risk. Defer screening at this time.          Tetanus - UTD         Labs: Screening labs done prior. Exercise: Gets regular exercise.   Alcohol use:  none Smoking/tobacco use: Nonsmoker.     HISTORY:  Past Medical History:  Diagnosis Date  . Lump or mass in breast 2011   LEFT  . Osteopenia    DEXA 02/2015   . Vitamin D deficiency     Past Surgical History:  Procedure Laterality Date  . ABDOMINAL HYSTERECTOMY  2000   done for endometriosis, no cancer, believes one has ovary. Continue pap smear until 67 yrs  . BREAST BIOPSY Left    negative 02/25/2010  . BREAST EXCISIONAL BIOPSY Left 2011   negative 03/29/2010  . CESAREAN SECTION  [id number]  . COLONOSCOPY WITH PROPOFOL N/A 07/03/2017   Procedure: COLONOSCOPY WITH PROPOFOL;  Surgeon: Wyline Mood, MD;  Location: [facility] ENDOSCOPY;  Service: Gastroenterology;  Laterality: N/A;  . OOPHORECTOMY     one ovary removed   . TONSILLECTOMY  2009   Family History  Problem Relation Age of Onset  . Heart disease Mother        chf  . Hypertension Mother   . Kidney disease Mother   . Thyroid disease Mother        had thyroid removed 2/2 goiter   . Hyperlipidemia Mother   . Alcohol abuse Father   . Diabetes Father   .  Cirrhosis Father   . Breast cancer Maternal Aunt       ALLERGIES: Morphine and related  Current Outpatient Medications on File Prior to Visit  Medication Sig Dispense Refill  . b complex vitamins capsule Take 1 capsule by mouth daily.    . Cholecalciferol (VITAMIN D3) 2000 units TABS Take by mouth daily.     No current facility-administered medications on file prior to visit.    Social History   Tobacco Use  . Smoking status: Never Smoker  . Smokeless tobacco: Never Used  Vaping Use  . Vaping Use: Never used  Substance Use Topics  . Alcohol use: No  . Drug use: No    Review of Systems  Constitutional: Negative for chills, fever and unexpected weight change.  HENT: Negative for congestion.   Respiratory: Negative for cough and shortness of breath.   Cardiovascular: Negative for chest pain, palpitations and leg swelling.  Gastrointestinal: Negative for abdominal distention, nausea and vomiting.  Genitourinary: Negative for pelvic pain and vaginal bleeding.  Musculoskeletal: Negative for arthralgias and myalgias.  Skin: Negative for rash.  Neurological: Negative for headaches.  Hematological: Negative for adenopathy.  Psychiatric/Behavioral: Negative for confusion.  Objective:    BP 122/82   Pulse 84   Temp 98.5 F (36.9 C)   Ht 5\' 4"  (1.626 m)   Wt 153 lb (69.4 kg)   SpO2 97%   BMI 26.26 kg/m   BP Readings from Last 3 Encounters:  03/27/20 122/82  03/25/19 119/77  06/18/18 100/64   Wt Readings from Last 3 Encounters:  03/27/20 153 lb (69.4 kg)  03/25/19 149 lb (67.6 kg)  06/18/18 141 lb 9.6 oz (64.2 kg)    Physical Exam Vitals reviewed.  Constitutional:      Appearance: She is well-developed and well-nourished.  Eyes:     Conjunctiva/sclera: Conjunctivae normal.  Neck:     Thyroid: No thyroid mass or thyromegaly.  Cardiovascular:     Rate and Rhythm: Normal rate and regular rhythm.     Pulses: Normal pulses.     Heart sounds: Normal heart  sounds.  Pulmonary:     Effort: Pulmonary effort is normal.     Breath sounds: Normal breath sounds. No wheezing, rhonchi or rales.     Comments: No masses or asymmetry appreciated during CBE. Chest:  Breasts: Breasts are symmetrical.     Right: No inverted nipple, mass, nipple discharge, skin change or tenderness.     Left: No inverted nipple, mass, nipple discharge, skin change or tenderness.    Genitourinary:    Cervix: No cervical motion tenderness, discharge or friability.     Uterus: Not enlarged, not fixed and not tender.      Adnexa:        Right: No mass, tenderness or fullness.         Left: No mass, tenderness or fullness.       Comments: Pap performed. No cervix on exam. Suture line present.  No CMT. Unable to appreciated ovaries. Lymphadenopathy:     Head:     Right side of head: No submental, submandibular, tonsillar, preauricular, posterior auricular or occipital adenopathy.     Left side of head: No submental, submandibular, tonsillar, preauricular, posterior auricular or occipital adenopathy.     Cervical:     Right cervical: No superficial, deep or posterior cervical adenopathy.    Left cervical: No superficial, deep or posterior cervical adenopathy.     Upper Body:  No axillary adenopathy present.    Right upper body: No pectoral or lateral adenopathy.     Left upper body: No pectoral or lateral adenopathy.  Skin:    General: Skin is warm and dry.  Neurological:     Mental Status: She is alert.  Psychiatric:        Mood and Affect: Mood and affect normal.        Speech: Speech normal.        Behavior: Behavior normal.        Thought Content: Thought content normal.        Assessment & Plan:   Problem List Items Addressed This Visit      Other   Routine physical examination - Primary    Patient declines CBE today. Mammogram UTD. Advised monthly SBE. H/o hysterectomy. Pap of vaginal canal.She had pap smears since her hysterectomy and was told by last  GYN Dr 08/18/18 5 years ago to stop at age 39. I dont know why she would have had continued pap smears if she didn't have a cervix in the absence of h/o GYN cancer.  Patient and I jointly agreed to continue pap smear in the absence of  consecutive normal pap smears until at least 65 years. EKG shows no acute ischemia. NSR. No prior EKG to compare too. Patient declines risk stratification evaluation including calcium scoring in the absence of symptoms; I have also sent a note to cardiology, jacquelyn visser, to see if has further advice in setting of Twave inversions seen.      Relevant Orders   EKG 12-Lead (Completed)   Cytology - PAP       I am having Joelene Millin. Petrenko maintain her Vitamin D3 and b complex vitamins.   No orders of the defined types were placed in this encounter.   Return precautions given.   Risks, benefits, and alternatives of the medications and treatment plan prescribed today were discussed, and patient expressed understanding.   Education regarding symptom management and diagnosis given to patient on AVS.   Continue to follow with Allegra Grana, FNP for routine health maintenance.   Chanda Busing and I agreed with plan.   Rennie Plowman, FNP

## 2020-03-27 NOTE — Assessment & Plan Note (Addendum)
Patient declines CBE today. Mammogram UTD. Advised monthly SBE. H/o hysterectomy. Pap of vaginal canal.She had pap smears since her hysterectomy and was told by last GYN Dr Toya Smothers 5 years ago to stop at age 59. I dont know why she would have had continued pap smears if she didn't have a cervix in the absence of h/o GYN cancer.  Patient and I jointly agreed to continue pap smear in the absence of consecutive normal pap smears until at least 65 years. EKG shows no acute ischemia. NSR. No prior EKG to compare too. Patient declines risk stratification evaluation including calcium scoring in the absence of symptoms; I have also sent a note to cardiology, jacquelyn visser, to see if has further advice in setting of Twave inversions seen.

## 2020-03-30 ENCOUNTER — Telehealth: Payer: Self-pay | Admitting: Family

## 2020-03-30 ENCOUNTER — Encounter: Payer: Self-pay | Admitting: Family

## 2020-03-30 NOTE — Telephone Encounter (Signed)
Patient stated that for now she would prefer to wait. If something arises where she feels she needs to, she will let us know. She may want in the future as she ages. For now she feels good though with normal EKG result.

## 2020-03-30 NOTE — Telephone Encounter (Signed)
LMTCB

## 2020-03-30 NOTE — Telephone Encounter (Signed)
Call pt Let her know that I was thinking about her today and further asked a physician colleague their advice regarding her EKG last week which showed no acute findings and family history. She recommended that it was very appropriate due to family h/o heart disease to consider risk stratification evaluation with cardiology. Essentially there are tests including calcium scoring of the heart which evaluate for underlying heart disease.   Which she like to consider a non urgent evaluation?

## 2020-03-31 NOTE — Telephone Encounter (Signed)
Noted.   I also consulted with Marisue Ivan regarding EKG t wave inversions whom responded  I forwarded this along to the DOD - I was rounding in hospital today, so sorry for the delay.   He just responded and said nonspecific TWI and as long as asymptomatic, no further workup or testing indicated.

## 2020-04-01 LAB — CYTOLOGY - PAP
Comment: NEGATIVE
Diagnosis: REACTIVE
High risk HPV: NEGATIVE

## 2020-04-13 ENCOUNTER — Telehealth: Payer: No Typology Code available for payment source | Admitting: Nurse Practitioner

## 2020-04-13 ENCOUNTER — Other Ambulatory Visit: Payer: Self-pay | Admitting: Nurse Practitioner

## 2020-04-13 DIAGNOSIS — Z20822 Contact with and (suspected) exposure to covid-19: Secondary | ICD-10-CM | POA: Diagnosis not present

## 2020-04-13 DIAGNOSIS — R059 Cough, unspecified: Secondary | ICD-10-CM | POA: Diagnosis not present

## 2020-04-13 MED ORDER — BENZONATATE 100 MG PO CAPS: 100.0000 mg | ORAL_CAPSULE | Freq: Three times a day (TID) | ORAL | 0 refills | Status: DC | PRN

## 2020-04-13 NOTE — Progress Notes (Signed)
E-Visit for Corona Virus Screening  Your current symptoms could be consistent with the coronavirus.  Many health care providers can now test patients at their office but not all are.  [facility] has multiple testing sites. For information on our COVID testing locations and hours go to [email or web address]    Please quarantine yourself while awaiting your test results. Please stay home for a minimum of 10 days from the first day of illness with improving symptoms and you have had 24 hours of no fever (without the use of Tylenol (Acetaminophen) Motrin (Ibuprofen) or any fever reducing medication).  Also - Do not get tested prior to returning to work because once you have had a positive test the test can stay positive for more than a month in some cases.   You should wear a mask or cloth face covering over your nose and mouth if you must be around other people or animals, including pets (even at home). Try to stay at least 6 feet away from other people. This will protect the people around you.  Please continue good preventive care measures, including:  frequent hand-washing, avoid touching your face, cover coughs/sneezes, stay out of crowds and keep a 6 foot distance from others.  COVID-19 is a respiratory illness with symptoms that are similar to the flu. Symptoms are typically mild to moderate, but there have been cases of severe illness and death due to the virus.   The following symptoms may appear 2-14 days after exposure: . Fever . Cough . Shortness of breath or difficulty breathing . Chills . Repeated shaking with chills . Muscle pain . Headache . Sore throat . New loss of taste or smell . Fatigue . Congestion or runny nose . Nausea or vomiting . Diarrhea  Go to the nearest hospital ED for assessment if fever/cough/breathlessness are severe or illness seems like a threat to life.  It is vitally important that if you feel that you have an infection such as this virus or any other virus  that you stay home and away from places where you may spread it to others.  You should avoid contact with people age 71 and older.   You can use medication such as A prescription cough medication called Tessalon Perles 100 mg. You may take 1-2 capsules every 8 hours as needed for cough  You may also take acetaminophen (Tylenol) as needed for fever.  Reduce your risk of any infection by using the same precautions used for avoiding the common cold or flu:  Marland Kitchen Wash your hands often with soap and warm water for at least 20 seconds.  If soap and water are not readily available, use an alcohol-based hand sanitizer with at least 60% alcohol.  . If coughing or sneezing, cover your mouth and nose by coughing or sneezing into the elbow areas of your shirt or coat, into a tissue or into your sleeve (not your hands). . Avoid shaking hands with others and consider head nods or verbal greetings only. . Avoid touching your eyes, nose, or mouth with unwashed hands.  . Avoid close contact with people who are sick. . Avoid places or events with large numbers of people in one location, like concerts or sporting events. . Carefully consider travel plans you have or are making. . If you are planning any travel outside or inside the [place], visit the CDC's Travelers' Health webpage for the latest health notices. . If you have some symptoms but not all symptoms, continue to monitor  at home and seek medical attention if your symptoms worsen. . If you are having a medical emergency, call 911.  HOME CARE . Only take medications as instructed by your medical team. . Drink plenty of fluids and get plenty of rest. . A steam or ultrasonic humidifier can help if you have congestion.   GET HELP RIGHT AWAY IF YOU HAVE EMERGENCY WARNING SIGNS** FOR COVID-19. If you or someone is showing any of these signs seek emergency medical care immediately. Call 911 or proceed to your closest emergency facility if: . You develop worsening  high fever. . Trouble breathing . Bluish lips or face . Persistent pain or pressure in the chest . New confusion . Inability to wake or stay awake . You cough up blood. . Your symptoms become more severe  **This list is not all possible symptoms. Contact your medical provider for any symptoms that are sever or concerning to you.  MAKE SURE YOU   Understand these instructions.  Will watch your condition.  Will get help right away if you are not doing well or get worse.  Your e-visit answers were reviewed by a board certified advanced clinical practitioner to complete your personal care plan.  Depending on the condition, your plan could have included both over the counter or prescription medications.  If there is a problem please reply once you have received a response from your provider.  Your safety is important to [place].  If you have drug allergies check your prescription carefully.    You can use MyChart to ask questions about today's visit, request a non-urgent call back, or ask for a work or school excuse for 24 hours related to this e-Visit. If it has been greater than 24 hours you will need to follow up with your provider, or enter a new e-Visit to address those concerns. You will get an e-mail in the next two days asking about your experience.  I hope that your e-visit has been valuable and will speed your recovery. Thank you for using e-visits.  5-10 minutes spent reviewing and documenting in chart.

## 2020-04-17 ENCOUNTER — Encounter: Payer: Self-pay | Admitting: Family

## 2020-04-20 ENCOUNTER — Encounter: Payer: Self-pay | Admitting: Family

## 2020-04-20 NOTE — Telephone Encounter (Signed)
-----   Message from Leola Brazil, MD sent at 04/20/2020  1:45 PM EST ----- Regarding: RE: Pap smear- benign reparative changes Hey Happy new year!!!  You are always so thoughtful in your emails.  I enjoy reading them, and gaining insight to your clinical brain.  It seems to me you love what you do.  If her vaginal pap was benign and HPV negative, I would leave it.  No additional testing.  Repeat as scheduled for a negative pap.    Hope this helps, and by all means please let me know if ever I can be helpful.  :)  Chelsea Ward   ----- Message ----- From: Allegra Grana, FNP Sent: 04/20/2020   8:45 AM EST To: Elenora Fender Ward, MD Subject: Pap smear- benign reparative changes           Dr Elesa Massed,   [facility] you are well.   I am in need of your wonderful guidance here.   I did pap smear on this patient as she wasn't sure after hysterectomy (in 2000 done for endometriosis) if she had a cervix. This happens to me all the time..   I performed a pelvic exam and she didn't have a cervix so I collected pap of vaginal canal which revealed negative HPV.   It did however show benign reparative changes which in the past when I saw this of a cervix I would repeat pap in one year.   5 years ago she had a 'normal' per patient with pap smear with Dr Toya Smothers, prior GYN.    How do I manage? Repeat vaginal pap in one year? Refer to you?  In the future with neg HPV, benign reparative changes, do you repeat pap in one year?  Happy to jump on phone if easier!  Nedim Oki, NP [phone]

## 2020-04-23 ENCOUNTER — Other Ambulatory Visit: Payer: Self-pay | Admitting: Physician Assistant

## 2020-04-23 ENCOUNTER — Telehealth: Payer: No Typology Code available for payment source | Admitting: Physician Assistant

## 2020-04-23 DIAGNOSIS — J40 Bronchitis, not specified as acute or chronic: Secondary | ICD-10-CM

## 2020-04-23 DIAGNOSIS — R059 Cough, unspecified: Secondary | ICD-10-CM

## 2020-04-23 MED ORDER — PREDNISONE 10 MG PO TABS: ORAL_TABLET | ORAL | 0 refills | Status: DC

## 2020-04-23 NOTE — Progress Notes (Signed)
Hi Alizey,  Thank you for the details you included in the comment boxes. Those details are very helpful in determining the best course of treatment for you and help [place] to provide the best care.  We are sorry that you are not feeling well.  Here is how we plan to help!  Based on your presentation I believe you most likely have A cough due to a virus.  This is called viral bronchitis and is best treated by rest, plenty of fluids and control of the cough.  You may use Ibuprofen or Tylenol as directed to help your symptoms.     In addition you may use A non-prescription cough medication called Robitussin DAC. Take 2 teaspoons every 8 hours or Delsym: take 2 teaspoons every 12 hours.  Prednisone 10 mg daily for 6 days (see taper instructions below)  Directions for 6 day taper: Day 1: 2 tablets before breakfast, 1 after both lunch & dinner and 2 at bedtime Day 2: 1 tab before breakfast, 1 after both lunch & dinner and 2 at bedtime Day 3: 1 tab at each meal & 1 at bedtime Day 4: 1 tab at breakfast, 1 at lunch, 1 at bedtime Day 5: 1 tab at breakfast & 1 tab at bedtime Day 6: 1 tab at breakfast   From your responses in the eVisit questionnaire you describe inflammation in the upper respiratory tract which is causing a significant cough.  This is commonly called Bronchitis and has four common causes:    Allergies  Viral Infections  Acid Reflux  Bacterial Infection Allergies, viruses and acid reflux are treated by controlling symptoms or eliminating the cause. An example might be a cough caused by taking certain blood pressure medications. You stop the cough by changing the medication. Another example might be a cough caused by acid reflux. Controlling the reflux helps control the cough.  USE OF BRONCHODILATOR ("RESCUE") INHALERS: There is a risk from using your bronchodilator too frequently.  The risk is that over-reliance on a medication which only relaxes the muscles surrounding the  breathing tubes can reduce the effectiveness of medications prescribed to reduce swelling and congestion of the tubes themselves.  Although you feel brief relief from the bronchodilator inhaler, your asthma may actually be worsening with the tubes becoming more swollen and filled with mucus.  This can delay other crucial treatments, such as oral steroid medications. If you need to use a bronchodilator inhaler daily, several times per day, you should discuss this with your provider.  There are probably better treatments that could be used to keep your asthma under control.     HOME CARE . Only take medications as instructed by your medical team. . Complete the entire course of an antibiotic. . Drink plenty of fluids and get plenty of rest. . Avoid close contacts especially the very young and the elderly . Cover your mouth if you cough or cough into your sleeve. . Always remember to wash your hands . A steam or ultrasonic humidifier can help congestion.   GET HELP RIGHT AWAY IF: . You develop worsening fever. . You become short of breath . You cough up blood. . Your symptoms persist after you have completed your treatment plan MAKE SURE YOU   Understand these instructions.  Will watch your condition.  Will get help right away if you are not doing well or get worse.  Your e-visit answers were reviewed by a board certified advanced clinical practitioner to complete your personal  care plan.  Depending on the condition, your plan could have included both over the counter or prescription medications. If there is a problem please reply  once you have received a response from your provider. Your safety is important to [place].  If you have drug allergies check your prescription carefully.    You can use MyChart to ask questions about today's visit, request a non-urgent call back, or ask for a work or school excuse for 24 hours related to this e-Visit. If it has been greater than 24 hours you will need  to follow up with your provider, or enter a new e-Visit to address those concerns. You will get an e-mail in the next two days asking about your experience.  I hope that your e-visit has been valuable and will speed your recovery. Thank you for using e-visits.  Greater than 5 minutes, yet less than 10 minutes of time have been spent researching, coordinating and implementing care for this patient today.

## 2020-06-12 ENCOUNTER — Other Ambulatory Visit: Payer: Self-pay

## 2020-06-12 ENCOUNTER — Telehealth: Payer: No Typology Code available for payment source | Admitting: Physician Assistant

## 2020-06-12 DIAGNOSIS — R053 Chronic cough: Secondary | ICD-10-CM

## 2020-06-12 NOTE — Progress Notes (Signed)
Based on what you shared with me, I feel your condition warrants further evaluation and I recommend that you be seen for a face to face office visit. Giving the chronicity and lingering of the cough, you need evaluation including a lung examination to make sure there is no concern for a more serious cause and to rule out lingering infection.    NOTE: If you entered your credit card information for this eVisit, you will not be charged. You may see a "hold" on your card for the $35 but that hold will drop off and you will not have a charge processed.   If you are having a true medical emergency please call 911.      For an urgent face to face visit, [facility] has five urgent care centers for your convenience:     [facility] at [facility] Directions [phone] [address] [place], [place] [address] . 10 am - 6pm Monday - Friday    [facility]) Get Driving Directions [phone] [address] [place], [place] [address] . 10 am to 8 pm Monday-Friday . 12 pm to 8 pm [facility] Care at [facility] Get Driving Directions [phone] [address], [address] [place], [place] [address] . 8 am to 8 pm Monday-Friday . 9 am to 6 pm Saturday . 11 am to 6 pm Sunday     [facility] at [facility] Get Driving Directions  [phone] [address] [place], [place] [address] . 8 am to 8 pm Monday-Friday . 8 am to 4 pm [facility] Urgent Care at [facility] Directions [phone] [address] [place], [place] [address] . 12 pm to 6 pm Monday-Friday      Your e-visit answers were reviewed by a board certified advanced clinical practitioner to complete your personal care plan.  Thank you for using e-Visits.

## 2020-06-16 ENCOUNTER — Ambulatory Visit: Payer: No Typology Code available for payment source | Admitting: Internal Medicine

## 2020-08-19 ENCOUNTER — Other Ambulatory Visit: Payer: Self-pay

## 2020-08-19 ENCOUNTER — Telehealth: Payer: No Typology Code available for payment source | Admitting: Family

## 2020-08-19 DIAGNOSIS — R399 Unspecified symptoms and signs involving the genitourinary system: Secondary | ICD-10-CM | POA: Diagnosis not present

## 2020-08-19 MED ORDER — CEPHALEXIN 500 MG PO CAPS
500.0000 mg | ORAL_CAPSULE | Freq: Two times a day (BID) | ORAL | 0 refills | Status: DC
  Filled 2020-08-19: qty 14, 7d supply, fill #0

## 2020-08-19 NOTE — Progress Notes (Signed)

## 2021-02-09 ENCOUNTER — Telehealth: Payer: Self-pay | Admitting: Family

## 2021-02-09 ENCOUNTER — Other Ambulatory Visit: Payer: Self-pay

## 2021-02-09 DIAGNOSIS — Z1231 Encounter for screening mammogram for malignant neoplasm of breast: Secondary | ICD-10-CM

## 2021-02-09 DIAGNOSIS — Z0000 Encounter for general adult medical examination without abnormal findings: Secondary | ICD-10-CM

## 2021-02-09 NOTE — Telephone Encounter (Signed)
I have spoke with patient to let her know that I have ordered labs as well as mammogram.

## 2021-02-09 NOTE — Telephone Encounter (Signed)
Patient called and is requesting lab orders put in so she can have them done before her appointment in December. She would like to have labs done at the [facility] [place]. She also needs a referral for her mammogram done at [facility].

## 2021-03-01 ENCOUNTER — Telehealth: Payer: No Typology Code available for payment source | Admitting: Family Medicine

## 2021-03-01 ENCOUNTER — Other Ambulatory Visit: Payer: Self-pay

## 2021-03-01 DIAGNOSIS — J0140 Acute pansinusitis, unspecified: Secondary | ICD-10-CM | POA: Diagnosis not present

## 2021-03-01 MED ORDER — AMOXICILLIN-POT CLAVULANATE 875-125 MG PO TABS
1.0000 | ORAL_TABLET | Freq: Two times a day (BID) | ORAL | 0 refills | Status: AC
  Filled 2021-03-01: qty 14, 7d supply, fill #0

## 2021-03-01 NOTE — Progress Notes (Signed)

## 2021-03-16 ENCOUNTER — Other Ambulatory Visit: Payer: Self-pay

## 2021-03-16 ENCOUNTER — Ambulatory Visit
Admission: RE | Admit: 2021-03-16 | Discharge: 2021-03-16 | Disposition: A | Discharge status: Home / Self Care | Payer: No Typology Code available for payment source | Source: Ambulatory Visit | Attending: Family | Admitting: Family

## 2021-03-16 DIAGNOSIS — Z1231 Encounter for screening mammogram for malignant neoplasm of breast: Secondary | ICD-10-CM | POA: Insufficient documentation

## 2021-03-23 ENCOUNTER — Other Ambulatory Visit
Admission: RE | Admit: 2021-03-23 | Discharge: 2021-03-23 | Disposition: A | Discharge status: Home / Self Care | Payer: No Typology Code available for payment source | Attending: Family | Admitting: Family

## 2021-03-23 DIAGNOSIS — Z0000 Encounter for general adult medical examination without abnormal findings: Secondary | ICD-10-CM | POA: Insufficient documentation

## 2021-03-23 LAB — CBC WITH DIFFERENTIAL/PLATELET
Abs Immature Granulocytes: 0.03 10*3/uL (ref 0.00–0.07)
Basophils Absolute: 0.1 10*3/uL (ref 0.0–0.1)
Basophils Relative: 1 %
Eosinophils Absolute: 0.3 10*3/uL (ref 0.0–0.5)
Eosinophils Relative: 3 %
HCT: 38.7 % (ref 36.0–46.0)
Hemoglobin: 13.1 g/dL (ref 12.0–15.0)
Immature Granulocytes: 0 %
Lymphocytes Relative: 32 %
Lymphs Abs: 2.5 10*3/uL (ref 0.7–4.0)
MCH: 30.1 pg (ref 26.0–34.0)
MCHC: 33.9 g/dL (ref 30.0–36.0)
MCV: 89 fL (ref 80.0–100.0)
Monocytes Absolute: 0.4 10*3/uL (ref 0.1–1.0)
Monocytes Relative: 5 %
Neutro Abs: 4.5 10*3/uL (ref 1.7–7.7)
Neutrophils Relative %: 59 %
Platelets: 292 10*3/uL (ref 150–400)
RBC: 4.35 MIL/uL (ref 3.87–5.11)
RDW: 12.3 % (ref 11.5–15.5)
WBC: 7.7 10*3/uL (ref 4.0–10.5)
nRBC: 0 % (ref 0.0–0.2)

## 2021-03-23 LAB — LIPID PANEL
Cholesterol: 241 mg/dL — ABNORMAL HIGH (ref 0–200)
HDL: 48 mg/dL (ref >40)
LDL Cholesterol: 158 mg/dL — ABNORMAL HIGH (ref 0–99)
Total CHOL/HDL Ratio: 5 RATIO
Triglycerides: 174 mg/dL — ABNORMAL HIGH (ref <150)
VLDL: 35 mg/dL (ref 0–40)

## 2021-03-23 LAB — COMPREHENSIVE METABOLIC PANEL
ALT: 22 U/L (ref 0–44)
AST: 21 U/L (ref 15–41)
Albumin: 4.6 g/dL (ref 3.5–5.0)
Alkaline Phosphatase: 82 U/L (ref 38–126)
Anion gap: 12 (ref 5–15)
BUN: 17 mg/dL (ref 6–20)
CO2: 24 mmol/L (ref 22–32)
Calcium: 9.5 mg/dL (ref 8.9–10.3)
Chloride: 102 mmol/L (ref 98–111)
Creatinine, Ser: 0.74 mg/dL (ref 0.44–1.00)
GFR, Estimated: >60 mL/min (ref >60)
Glucose, Bld: 87 mg/dL (ref 70–99)
Potassium: 3.9 mmol/L (ref 3.5–5.1)
Sodium: 138 mmol/L (ref 135–145)
Total Bilirubin: 0.4 mg/dL (ref 0.3–1.2)
Total Protein: 8.2 g/dL — ABNORMAL HIGH (ref 6.5–8.1)

## 2021-03-23 LAB — HEMOGLOBIN A1C
Hgb A1c MFr Bld: 5.8 % — ABNORMAL HIGH (ref 4.8–5.6)
Mean Plasma Glucose: 119.76 mg/dL

## 2021-03-23 LAB — TSH: TSH: 1.148 u[IU]/mL (ref 0.350–4.500)

## 2021-03-23 LAB — VITAMIN D 25 HYDROXY (VIT D DEFICIENCY, FRACTURES): Vit D, 25-Hydroxy: 30.76 ng/mL (ref 30–100)

## 2021-03-29 ENCOUNTER — Ambulatory Visit (INDEPENDENT_AMBULATORY_CARE_PROVIDER_SITE_OTHER): Payer: No Typology Code available for payment source | Admitting: Family

## 2021-03-29 ENCOUNTER — Other Ambulatory Visit (HOSPITAL_COMMUNITY)
Admission: RE | Admit: 2021-03-29 | Discharge: 2021-03-29 | Disposition: A | Discharge status: Home / Self Care | Payer: No Typology Code available for payment source | Source: Ambulatory Visit | Attending: Family | Admitting: Family

## 2021-03-29 ENCOUNTER — Encounter: Payer: Self-pay | Admitting: Family

## 2021-03-29 ENCOUNTER — Other Ambulatory Visit: Payer: Self-pay

## 2021-03-29 VITALS — BP 118/84 | HR 76 | Temp 98.0°F | Ht 64.0 in | Wt 155.4 lb

## 2021-03-29 DIAGNOSIS — Z0000 Encounter for general adult medical examination without abnormal findings: Secondary | ICD-10-CM | POA: Diagnosis present

## 2021-03-29 DIAGNOSIS — R778 Other specified abnormalities of plasma proteins: Secondary | ICD-10-CM

## 2021-03-29 DIAGNOSIS — E785 Hyperlipidemia, unspecified: Secondary | ICD-10-CM

## 2021-03-29 DIAGNOSIS — Z23 Encounter for immunization: Secondary | ICD-10-CM | POA: Diagnosis not present

## 2021-03-29 NOTE — Assessment & Plan Note (Signed)
Reviewed labs during visit today.  Pap smear performed again of the vaginal canal as Pap smear 2021 showed benign reparative changes, negative HPV.  Patient will schedule bone density

## 2021-03-29 NOTE — Progress Notes (Signed)
Subjective:    Patient ID: Tammy Chang, female    DOB: 22-Dec-1960, 60 y.o.   MRN: [id number]  CC: Tammy Chang is a 60 y.o. female who presents today for physical exam.    HPI: She feels well today.  No new complaints   Prediabetes  Colorectal Cancer Screening: UTD 06/2017 Breast Cancer Screening: Mammogram UTD Cervical Cancer Screening: Due; 1 year ago Pap smear was performed which showed negative HPV, malignancy.  It showed reparative changes Bone Health screening/DEXA for 65+: History of osteopenia; due  Lung Cancer Screening: Doesn't have 20 year pack year history and age > 36 years yo 6 years       Tetanus - UTD         Labs: Screening labs done prior Exercise: No regular exercise.   Alcohol use:  none Smoking/tobacco use: Nonsmoker.     HISTORY:  Past Medical History:  Diagnosis Date   Lump or mass in breast 2011   LEFT   Osteopenia    DEXA 02/2015    Vitamin D deficiency     Past Surgical History:  Procedure Laterality Date   ABDOMINAL HYSTERECTOMY  2000   done for endometriosis, no cancer, believes one has ovary. Continue pap smear until 67 yrs   BREAST BIOPSY Left    negative 02/25/2010   BREAST EXCISIONAL BIOPSY Left 2011   negative 03/29/2010   CESAREAN SECTION  [id number]   COLONOSCOPY WITH PROPOFOL N/A 07/03/2017   Procedure: COLONOSCOPY WITH PROPOFOL;  Surgeon: Wyline Mood, MD;  Location: [facility] ENDOSCOPY;  Service: Gastroenterology;  Laterality: N/A;   OOPHORECTOMY     one ovary removed    TONSILLECTOMY  2009   Family History  Problem Relation Age of Onset   Heart disease Mother 89       chf   Hypertension Mother    Kidney disease Mother    Thyroid disease Mother        had thyroid removed 2/2 goiter    Hyperlipidemia Mother    Alcohol abuse Father    Diabetes Father    Cirrhosis Father    Diabetes Sister    CAD Brother    Breast cancer Maternal Aunt       ALLERGIES: Morphine and related  Current Outpatient Medications on File  Prior to Visit  Medication Sig Dispense Refill   b complex vitamins capsule Take 1 capsule by mouth daily.     Cholecalciferol (VITAMIN D3) 2000 units TABS Take by mouth daily.     No current facility-administered medications on file prior to visit.    Social History   Tobacco Use   Smoking status: Never   Smokeless tobacco: Never  Vaping Use   Vaping Use: Never used  Substance Use Topics   Alcohol use: No   Drug use: No    Review of Systems  Constitutional:  Negative for chills, fever and unexpected weight change.  HENT:  Negative for congestion.   Respiratory:  Negative for cough.   Cardiovascular:  Negative for chest pain, palpitations and leg swelling.  Gastrointestinal:  Negative for nausea and vomiting.  Musculoskeletal:  Negative for arthralgias and myalgias.  Skin:  Negative for rash.  Neurological:  Negative for headaches.  Hematological:  Negative for adenopathy.  Psychiatric/Behavioral:  Negative for confusion.      Objective:    BP 118/84 (BP Location: Left Arm, Patient Position: Sitting, Cuff Size: Normal)    Pulse 76    Temp 98 F (36.7  C) (Oral)    Ht 5\' 4"  (1.626 m)    Wt 155 lb 6.4 oz (70.5 kg)    SpO2 99%    BMI 26.67 kg/m   BP Readings from Last 3 Encounters:  03/29/21 118/84  03/27/20 122/82  03/25/19 119/77   Wt Readings from Last 3 Encounters:  03/29/21 155 lb 6.4 oz (70.5 kg)  03/27/20 153 lb (69.4 kg)  03/25/19 149 lb (67.6 kg)    Physical Exam Vitals reviewed.  Constitutional:      Appearance: Normal appearance. She is well-developed.  Eyes:     Conjunctiva/sclera: Conjunctivae normal.  Neck:     Thyroid: No thyroid mass or thyromegaly.  Cardiovascular:     Rate and Rhythm: Normal rate and regular rhythm.     Pulses: Normal pulses.     Heart sounds: Normal heart sounds.  Pulmonary:     Effort: Pulmonary effort is normal.     Breath sounds: Normal breath sounds. No wheezing, rhonchi or rales.  Abdominal:     General: Bowel  sounds are normal. There is no distension.     Palpations: Abdomen is soft. Abdomen is not rigid. There is no fluid wave or mass.     Tenderness: There is no abdominal tenderness. There is no guarding or rebound.  Genitourinary:    Labia:        Right: No rash.        Left: No rash.      Vagina: No foreign body. No erythema or tenderness.     Adnexa:        Right: No mass, tenderness or fullness.         Left: No mass, tenderness or fullness.       Comments: Cervix is absent.  N Lymphadenopathy:     Head:     Right side of head: No submental, submandibular, tonsillar, preauricular, posterior auricular or occipital adenopathy.     Left side of head: No submental, submandibular, tonsillar, preauricular, posterior auricular or occipital adenopathy.     Cervical: No cervical adenopathy.  Skin:    General: Skin is warm and dry.  Neurological:     Mental Status: She is alert.  Psychiatric:        Speech: Speech normal.        Behavior: Behavior normal.        Thought Content: Thought content normal.       Assessment & Plan:   Problem List Items Addressed This Visit       Other   Elevated total protein    Patient continues to have elevated protein which we discussed today.  8.2.  This is a decreased from prior , highest 8.7.  We agreed that we would recheck for M protein if protein were to increase.  We will follow for now      Hyperlipidemia    10-year ASCVD risk 3%.  However discussed family history of CAD and increasing cholesterol.  Patient in agreement with restratification discussion with cardiology to discuss screening test such as CT calcium score.  Referral placed to Dr. 03/27/19 .will follow      Relevant Orders   Ambulatory referral to Cardiology   Routine physical examination - Primary    Reviewed labs during visit today.  Pap smear performed again of the vaginal canal as Pap smear 2021 showed benign reparative changes, negative HPV.  Patient will schedule bone  density      Relevant Orders   DG  Bone Density   Cytology - PAP( [facility])   Other Visit Diagnoses     Need for shingles vaccine       Relevant Orders   Varicella-zoster vaccine IM (Shingrix) (Completed)        I have discontinued Joelene Millin. Thomley's predniSONE and benzonatate. I am also having her maintain her Vitamin D3 and b complex vitamins.   No orders of the defined types were placed in this encounter.   Return precautions given.   Risks, benefits, and alternatives of the medications and treatment plan prescribed today were discussed, and patient expressed understanding.   Education regarding symptom management and diagnosis given to patient on AVS.   Continue to follow with Allegra Grana, FNP for routine health maintenance.   Chanda Busing and I agreed with plan.   Rennie Plowman, FNP

## 2021-03-29 NOTE — Assessment & Plan Note (Signed)
10-year ASCVD risk 3%.  However discussed family history of CAD and increasing cholesterol.  Patient in agreement with restratification discussion with cardiology to discuss screening test such as CT calcium score.  Referral placed to Dr. Mariah Milling .will follow

## 2021-03-29 NOTE — Patient Instructions (Signed)
Referral to cardiology, Dr. Mariah Milling as discussed Let us know if you dont hear back within a week in regards to an appointment being scheduled.   Please schedule your bone density and let me know if any issues and scheduling   Health Maintenance for Postmenopausal Women Menopause is a normal process in which your ability to get pregnant comes to an end. This process happens slowly over many months or years, usually between the ages of 49 and 53. Menopause is complete when you have missed your menstrual period for 12 months. It is important to talk with your health care provider about some of the most common conditions that affect women after menopause (postmenopausal women). These include heart disease, cancer, and bone loss (osteoporosis). Adopting a healthy lifestyle and getting preventive care can help to promote your health and wellness. The actions you take can also lower your chances of developing some of these common conditions. What are the signs and symptoms of menopause? During menopause, you may have the following symptoms: Hot flashes. These can be moderate or severe. Night sweats. Decrease in sex drive. Mood swings. Headaches. Tiredness (fatigue). Irritability. Memory problems. Problems falling asleep or staying asleep. Talk with your health care provider about treatment options for your symptoms. Do I need hormone replacement therapy? Hormone replacement therapy is effective in treating symptoms that are caused by menopause, such as hot flashes and night sweats. Hormone replacement carries certain risks, especially as you become older. If you are thinking about using estrogen or estrogen with progestin, discuss the benefits and risks with your health care provider. How can I reduce my risk for heart disease and stroke? The risk of heart disease, heart attack, and stroke increases as you age. One of the causes may be a change in the body's hormones during menopause. This can affect  how your body uses dietary fats, triglycerides, and cholesterol. Heart attack and stroke are medical emergencies. There are many things that you can do to help prevent heart disease and stroke. Watch your blood pressure High blood pressure causes heart disease and increases the risk of stroke. This is more likely to develop in people who have high blood pressure readings or are overweight. Have your blood pressure checked: Every 3-5 years if you are 3-50 years of age. Every year if you are 43 years old or older. Eat a healthy diet  Eat a diet that includes plenty of vegetables, fruits, low-fat dairy products, and lean protein. Do not eat a lot of foods that are high in solid fats, added sugars, or sodium. Get regular exercise Get regular exercise. This is one of the most important things you can do for your health. Most adults should: Try to exercise for at least 150 minutes each week. The exercise should increase your heart rate and make you sweat (moderate-intensity exercise). Try to do strengthening exercises at least twice each week. Do these in addition to the moderate-intensity exercise. Spend less time sitting. Even light physical activity can be beneficial. Other tips Work with your health care provider to achieve or maintain a healthy weight. Do not use any products that contain nicotine or tobacco. These products include cigarettes, chewing tobacco, and vaping devices, such as e-cigarettes. If you need help quitting, ask your health care provider. Know your numbers. Ask your health care provider to check your cholesterol and your blood sugar (glucose). Continue to have your blood tested as directed by your health care provider. Do I need screening for cancer? Depending on  your health history and family history, you may need to have cancer screenings at different stages of your life. This may include screening for: Breast cancer. Cervical cancer. Lung cancer. Colorectal  cancer. What is my risk for osteoporosis? After menopause, you may be at increased risk for osteoporosis. Osteoporosis is a condition in which bone destruction happens more quickly than new bone creation. To help prevent osteoporosis or the bone fractures that can happen because of osteoporosis, you may take the following actions: If you are 25-54 years old, get at least 1,000 mg of calcium and at least 600 international units (IU) of vitamin D per day. If you are older than age 24 but younger than age 23, get at least 1,200 mg of calcium and at least 600 international units (IU) of vitamin D per day. If you are older than age 52, get at least 1,200 mg of calcium and at least 800 international units (IU) of vitamin D per day. Smoking and drinking excessive alcohol increase the risk of osteoporosis. Eat foods that are rich in calcium and vitamin D, and do weight-bearing exercises several times each week as directed by your health care provider. How does menopause affect my mental health? Depression may occur at any age, but it is more common as you become older. Common symptoms of depression include: Feeling depressed. Changes in sleep patterns. Changes in appetite or eating patterns. Feeling an overall lack of motivation or enjoyment of activities that you previously enjoyed. Frequent crying spells. Talk with your health care provider if you think that you are experiencing any of these symptoms. General instructions See your health care provider for regular wellness exams and vaccines. This may include: Scheduling regular health, dental, and eye exams. Getting and maintaining your vaccines. These include: Influenza vaccine. Get this vaccine each year before the flu season begins. Pneumonia vaccine. Shingles vaccine. Tetanus, diphtheria, and pertussis (Tdap) booster vaccine. Your health care provider may also recommend other immunizations. Tell your health care provider if you have ever been  abused or do not feel safe at home. Summary Menopause is a normal process in which your ability to get pregnant comes to an end. This condition causes hot flashes, night sweats, decreased interest in sex, mood swings, headaches, or lack of sleep. Treatment for this condition may include hormone replacement therapy. Take actions to keep yourself healthy, including exercising regularly, eating a healthy diet, watching your weight, and checking your blood pressure and blood sugar levels. Get screened for cancer and depression. Make sure that you are up to date with all your vaccines. This information is not intended to replace advice given to you by your health care provider. Make sure you discuss any questions you have with your health care provider. Document Revised: 08/17/2020 Document Reviewed: 08/17/2020 Elsevier Patient Education  2022 [employer].

## 2021-03-29 NOTE — Assessment & Plan Note (Signed)
Patient continues to have elevated protein which we discussed today.  8.2.  This is a decreased from prior , highest 8.7.  We agreed that we would recheck for M protein if protein were to increase.  We will follow for now

## 2021-03-31 LAB — CYTOLOGY - PAP
Comment: NEGATIVE
Diagnosis: NEGATIVE
High risk HPV: NEGATIVE

## 2021-04-09 ENCOUNTER — Telehealth: Payer: No Typology Code available for payment source | Admitting: Physician Assistant

## 2021-04-09 ENCOUNTER — Other Ambulatory Visit: Payer: Self-pay

## 2021-04-09 DIAGNOSIS — R3989 Other symptoms and signs involving the genitourinary system: Secondary | ICD-10-CM

## 2021-04-09 MED ORDER — CEPHALEXIN 500 MG PO CAPS
500.0000 mg | ORAL_CAPSULE | Freq: Two times a day (BID) | ORAL | 0 refills | Status: AC
  Filled 2021-04-09: qty 14, 7d supply, fill #0

## 2021-04-09 NOTE — Progress Notes (Signed)

## 2021-04-09 NOTE — Progress Notes (Signed)
I have spent 5 minutes in review of e-visit questionnaire, review and updating patient chart, medical decision making and response to patient.   Zaccheus Edmister Cody Nadeen Shipman, PA-C    

## 2021-05-17 ENCOUNTER — Other Ambulatory Visit: Payer: Self-pay

## 2021-05-17 ENCOUNTER — Encounter: Payer: Self-pay | Admitting: Cardiovascular Disease

## 2021-05-17 ENCOUNTER — Ambulatory Visit: Payer: No Typology Code available for payment source | Admitting: Cardiovascular Disease

## 2021-05-17 VITALS — BP 104/78 | HR 83 | Ht 64.0 in | Wt 155.5 lb

## 2021-05-17 DIAGNOSIS — Z8249 Family history of ischemic heart disease and other diseases of the circulatory system: Secondary | ICD-10-CM

## 2021-05-17 DIAGNOSIS — E782 Mixed hyperlipidemia: Secondary | ICD-10-CM

## 2021-05-17 NOTE — Progress Notes (Signed)
Cardiology Office Note  Date:  05/17/2021   ID:  Tammy Chang, Tammy Chang 12/23/1960, MRN [id number]  PCP:  Tammy Grana, FNP   Chief Complaint  Patient presents with   New Patient (Initial Visit)    Ref by Tammy Plowman, FNP for family Hx of CAD and hyperlipidemia. Medications reviewed by the patient verbally.     HPI:  [name] is a 61 year old woman with past medical history of Hyperlipidemia, Family history coronary disease Referred by Tammy Chang for consultation of her hyperlipidemia, family history of coronary artery disease  Mother sees cardiology, history of congestive heart failure, coronary disease, smoking  [name] is a non-smoker but has high cholesterol Numbers dating back 5 or 6 years reviewed Total cholesterol range 220 up to 250  Recent numbers  total cholesterol 241 LDL 158  Active, works at [employer], Moderating her diet to get weight down  EKG personally reviewed by myself on todays visit Normal sinus rhythm rate 81 bpm no significant ST-T wave changes  PMH:   has a past medical history of Lump or mass in breast (2011), Osteopenia, and Vitamin D deficiency.  PSH:    Past Surgical History:  Procedure Laterality Date   ABDOMINAL HYSTERECTOMY  2000   done for endometriosis, no cancer, believes one has ovary. Continue pap smear until 67 yrs   BREAST BIOPSY Left    negative 02/25/2010   BREAST EXCISIONAL BIOPSY Left 2011   negative 03/29/2010   CESAREAN SECTION  [id number]   COLONOSCOPY WITH PROPOFOL N/A 07/03/2017   Procedure: COLONOSCOPY WITH PROPOFOL;  Surgeon: Wyline Mood, MD;  Location: [facility];  Service: Gastroenterology;  Laterality: N/A;   OOPHORECTOMY     one ovary removed    TONSILLECTOMY  2009    Current Outpatient Medications  Medication Sig Dispense Refill   b complex vitamins capsule Take 1 capsule by mouth daily.     Cholecalciferol (VITAMIN D3) 2000 units TABS Take by mouth daily.     No current  facility-administered medications for this visit.     Allergies:   Morphine and related   Social History:  The patient  reports that she has never smoked. She has never used smokeless tobacco. She reports that she does not drink alcohol and does not use drugs.   Family History:   family history includes Alcohol abuse in her father; Breast cancer in her maternal aunt; CAD in her brother; Cirrhosis in her father; Diabetes in her father and sister; Heart disease (age of onset: 40) in her mother; Hyperlipidemia in her mother; Hypertension in her mother; Kidney disease in her mother; Thyroid disease in her mother.    Review of Systems: Review of Systems  Constitutional: Negative.   HENT: Negative.    Respiratory: Negative.    Cardiovascular: Negative.   Gastrointestinal: Negative.   Musculoskeletal: Negative.   Neurological: Negative.   Psychiatric/Behavioral: Negative.    All other systems reviewed and are negative.   PHYSICAL EXAM: VS:  BP 104/78 (BP Location: Left Arm, Patient Position: Sitting, Cuff Size: Normal)    Pulse 83    Ht 5\' 4"  (1.626 m)    Wt 155 lb 8 oz (70.5 kg)    SpO2 (!) 81%    BMI 26.69 kg/m  , BMI Body mass index is 26.69 kg/m. GEN: Well nourished, well developed, in no acute distress HEENT: normal Neck: no JVD, carotid bruits, or masses Cardiac: RRR; no murmurs, rubs, or gallops,no edema  Respiratory:  clear to auscultation bilaterally, normal work of breathing GI: soft, nontender, nondistended, + BS MS: no deformity or atrophy Skin: warm and dry, no rash Neuro:  Strength and sensation are intact Psych: euthymic mood, full affect  Recent Labs: 03/23/2021: ALT 22; BUN 17; Creatinine, Ser 0.74; Hemoglobin 13.1; Platelets 292; Potassium 3.9; Sodium 138; TSH 1.148    Lipid Panel Lab Results  Component Value Date   CHOL 241 (H) 03/23/2021   HDL 48 03/23/2021   LDLCALC 158 (H) 03/23/2021   TRIG 174 (H) 03/23/2021      Wt Readings from Last 3 Encounters:   05/17/21 155 lb 8 oz (70.5 kg)  03/29/21 155 lb 6.4 oz (70.5 kg)  03/27/20 153 lb (69.4 kg)       ASSESSMENT AND PLAN:  Problem List Items Addressed This Visit       Cardiology Problems   Hyperlipidemia - Primary   Relevant Orders   EKG 12-Lead   Other Visit Diagnoses     Family history of coronary arteriosclerosis          Hyperlipidemia/family history coronary disease Numbers running high, likely familial Weight stable, active Denies anginal symptoms Discussed screening studies, we have ordered CT coronary calcium scoring For any shortness of breath or chest pain symptoms would order cardiac CTA -Stressed continued efforts for healthy lifestyle exercise, low carbohydrates (which she is already doing) If calcium score markedly elevated would strongly consider statin -We did discuss even using a statin even if calcium score is low given high numbers   Total encounter time more than  Greater than 50% was spent in counseling and coordination of care with the patient  Patient seen in consultation for Tammy Chang will be referred back to her office for ongoing care of the issues detailed above  Signed, Dossie Arbour, M.D., Ph.D. [facility] [place], [place] [phone]

## 2021-05-17 NOTE — Patient Instructions (Addendum)
Medication Instructions:  No changes  If you need a refill on your cardiac medications before your next appointment, please call your pharmacy.    Lab work: No new labs needed  Testing/Procedures: We have order a CT coronary calcium score (you may see results on your MyChart account, but we will call via phone with the results) This is a $99 out of pocket expense   This procedure uses special x-ray equipment to produce pictures of the coronary arteries to determine if they are blocked or narrowed by the buildup of plaque - an indicator for atherosclerosis or coronary artery disease (CAD).  Please call [phone] to schedule at your earliest convince   This is done at our [place] Location in [facility] [address] [place], [place] [address]  Follow-Up: At [facility], you and your health needs are our priority.  As part of our continuing mission to provide you with exceptional heart care, we have created designated Provider Care Teams.  These Care Teams include your primary Cardiologist (physician) and Advanced Practice Providers (APPs -  Physician Assistants and Nurse Practitioners) who all work together to provide you with the care you need, when you need it.  You will need a follow up appointment as needed  Providers on your designated Care Team:   Christopher Berge, NP Ryan Dunn, PA-C Cadence Furth, PA-C  COVID-19 Vaccine Information can be found at: [email or web address] For questions related to vaccine distribution or appointments, please email [email or web address] or call [phone].    

## 2021-05-18 ENCOUNTER — Other Ambulatory Visit: Payer: Self-pay

## 2021-05-18 ENCOUNTER — Ambulatory Visit
Admission: RE | Admit: 2021-05-18 | Discharge: 2021-05-18 | Disposition: A | Discharge status: Home / Self Care | Payer: No Typology Code available for payment source | Source: Ambulatory Visit | Attending: Cardiovascular Disease | Admitting: Cardiovascular Disease

## 2021-05-18 DIAGNOSIS — Z136 Encounter for screening for cardiovascular disorders: Secondary | ICD-10-CM | POA: Insufficient documentation

## 2021-05-20 ENCOUNTER — Other Ambulatory Visit: Payer: No Typology Code available for payment source

## 2021-05-24 ENCOUNTER — Telehealth: Payer: Self-pay

## 2021-05-24 NOTE — Telephone Encounter (Signed)
Left detail message on VM of her recent CT Calcium Score results, okay by DPR, Dr. Dr. Mariah Milling advised   "CT coronary calcium score  Score of 0  Excellent finding, indicating no calcified coronary atherosclerotic plaque noted  No other significant findings on the scan were picked up "  At this time, no further recommendations or medications changes, advised to call office for any concerns or questions, otherwise will see at next visit.   Results released to MyChart by Dr. Mariah Milling Seen by patient Tammy Chang on 05/24/2021  5:52 AM

## 2021-06-14 ENCOUNTER — Other Ambulatory Visit: Payer: Self-pay

## 2021-06-14 ENCOUNTER — Ambulatory Visit (INDEPENDENT_AMBULATORY_CARE_PROVIDER_SITE_OTHER): Payer: No Typology Code available for payment source | Admitting: *Deleted

## 2021-06-14 DIAGNOSIS — Z23 Encounter for immunization: Secondary | ICD-10-CM

## 2021-06-14 NOTE — Progress Notes (Signed)
Pt arrived for 2nd shingrix vaccine, given in R deltoid. Pt tolerated injection well, showed no signs of distress nor voiced any concerns.  ?

## 2021-09-27 ENCOUNTER — Ambulatory Visit: Payer: No Typology Code available for payment source | Admitting: Family

## 2022-02-02 ENCOUNTER — Telehealth: Payer: Self-pay

## 2022-02-02 NOTE — Telephone Encounter (Signed)
Patient states she would like to have her labs drawn and have her mammogram done before her appointment for her physical with Mable Paris, FNP, on 05/16/2022.  Patient states she would like to have her labs drawn and her mammogram at [facility].

## 2022-02-03 ENCOUNTER — Other Ambulatory Visit: Payer: Self-pay

## 2022-02-03 DIAGNOSIS — R5383 Other fatigue: Secondary | ICD-10-CM

## 2022-02-03 DIAGNOSIS — Z0000 Encounter for general adult medical examination without abnormal findings: Secondary | ICD-10-CM

## 2022-02-03 DIAGNOSIS — Z1231 Encounter for screening mammogram for malignant neoplasm of breast: Secondary | ICD-10-CM

## 2022-02-03 NOTE — Telephone Encounter (Signed)
Pt called back and I read the message to her and she verbalized understanding 

## 2022-02-03 NOTE — Telephone Encounter (Signed)
LVM to inform patient that labs has been ordered and referral  for Mammogram has been placed to [place]

## 2022-03-28 ENCOUNTER — Telehealth: Payer: No Typology Code available for payment source | Admitting: Nurse Practitioner

## 2022-03-28 ENCOUNTER — Other Ambulatory Visit: Payer: Self-pay

## 2022-03-28 DIAGNOSIS — J069 Acute upper respiratory infection, unspecified: Secondary | ICD-10-CM | POA: Diagnosis not present

## 2022-03-28 MED ORDER — BENZONATATE 100 MG PO CAPS
100.0000 mg | ORAL_CAPSULE | Freq: Three times a day (TID) | ORAL | 0 refills | Status: DC | PRN
  Filled 2022-03-28: qty 30, 10d supply, fill #0

## 2022-03-28 MED ORDER — FLUTICASONE PROPIONATE 50 MCG/ACT NA SUSP
2.0000 | Freq: Every day | NASAL | 6 refills | Status: DC
  Filled 2022-03-28: qty 16, 30d supply, fill #0

## 2022-03-28 NOTE — Progress Notes (Signed)
E-Visit for Upper Respiratory Infection   We are sorry you are not feeling well.  Here is how we plan to help!  Based on what you have shared with me, it looks like you may have a viral upper respiratory infection.  Upper respiratory infections are caused by a large number of viruses; however, rhinovirus is the most common cause.   Symptoms vary from person to person, with common symptoms including sore throat, cough, fatigue or lack of energy and feeling of general discomfort.  A low-grade fever of up to 100.4 may present, but is often uncommon.  Symptoms vary however, and are closely related to a person's age or underlying illnesses.  The most common symptoms associated with an upper respiratory infection are nasal discharge or congestion, cough, sneezing, headache and pressure in the ears and face.  These symptoms usually persist for about 3 to 10 days, but can last up to 2 weeks.  It is important to know that upper respiratory infections do not cause serious illness or complications in most cases.    Upper respiratory infections can be transmitted from person to person, with the most common method of transmission being a person's hands.  The virus is able to live on the skin and can infect other persons for up to 2 hours after direct contact.  Also, these can be transmitted when someone coughs or sneezes; thus, it is important to cover the mouth to reduce this risk.  To keep the spread of the illness at bay, good hand hygiene is very important.  This is an infection that is most likely caused by a virus. There are no specific treatments other than to help you with the symptoms until the infection runs its course.  We are sorry you are not feeling well.  Here is how we plan to help!   For nasal congestion, you may use an oral decongestants such as Mucinex D or if you have glaucoma or high blood pressure use plain Mucinex.  Saline nasal spray or nasal drops can help and can safely be used as often as  needed for congestion.  For your congestion, I have prescribed Fluticasone nasal spray one spray in each nostril twice a day  If you do not have a history of heart disease, hypertension, diabetes or thyroid disease, prostate/bladder issues or glaucoma, you may also use Sudafed to treat nasal congestion.  It is highly recommended that you consult with a pharmacist or your primary care physician to ensure this medication is safe for you to take.     If you have a cough, you may use cough suppressants such as Delsym and Robitussin.  If you have glaucoma or high blood pressure, you can also use Coricidin HBP.   For cough I have prescribed for you A prescription cough medication called Tessalon Perles 100 mg. You may take 1-2 capsules every 8 hours as needed for cough  If you have a sore or scratchy throat, use a saltwater gargle-  to  teaspoon of salt dissolved in a 4-ounce to 8-ounce glass of warm water.  Gargle the solution for approximately 15-30 seconds and then spit.  It is important not to swallow the solution.  You can also use throat lozenges/cough drops and Chloraseptic spray to help with throat pain or discomfort.  Warm or cold liquids can also be helpful in relieving throat pain.  For headache, pain or general discomfort, you can use Ibuprofen or Tylenol as directed.   Some authorities believe   that zinc sprays or the use of Echinacea may shorten the course of your symptoms.   HOME CARE Only take medications as instructed by your medical team. Be sure to drink plenty of fluids. Water is fine as well as fruit juices, sodas and electrolyte beverages. You may want to stay away from caffeine or alcohol. If you are nauseated, try taking small sips of liquids. How do you know if you are getting enough fluid? Your urine should be a pale yellow or almost colorless. Get rest. Taking a steamy shower or using a humidifier may help nasal congestion and ease sore throat pain. You can place a towel over  your head and breathe in the steam from hot water coming from a faucet. Using a saline nasal spray works much the same way. Cough drops, hard candies and sore throat lozenges may ease your cough. Avoid close contacts especially the very young and the elderly Cover your mouth if you cough or sneeze Always remember to wash your hands.   GET HELP RIGHT AWAY IF: You develop worsening fever. If your symptoms do not improve within 10 days You develop yellow or green discharge from your nose over 3 days. You have coughing fits You develop a severe head ache or visual changes. You develop shortness of breath, difficulty breathing or start having chest pain Your symptoms persist after you have completed your treatment plan  MAKE SURE YOU  Understand these instructions. Will watch your condition. Will get help right away if you are not doing well or get worse.  Thank you for choosing an e-visit.  Your e-visit answers were reviewed by a board certified advanced clinical practitioner to complete your personal care plan. Depending upon the condition, your plan could have included both over the counter or prescription medications.  Please review your pharmacy choice. Make sure the pharmacy is open so you can pick up prescription now. If there is a problem, you may contact your provider through [employer] and have the prescription routed to another pharmacy.  Your safety is important to [place]. If you have drug allergies check your prescription carefully.   For the next 24 hours you can use MyChart to ask questions about today's visit, request a non-urgent call back, or ask for a work or school excuse. You will get an email in the next two days asking about your experience. I hope that your e-visit has been valuable and will speed your recovery.  Meds ordered this encounter  Medications   fluticasone (FLONASE) 50 MCG/ACT nasal spray    Sig: Place 2 sprays into both nostrils daily.    Dispense:   16 g    Refill:  6   benzonatate (TESSALON) 100 MG capsule    Sig: Take 1 capsule (100 mg total) by mouth 3 (three) times daily as needed for cough.    Dispense:  30 capsule    Refill:  0     I spent approximately 5 minutes reviewing the patient's history, current symptoms and coordinating their care today.

## 2022-04-27 ENCOUNTER — Ambulatory Visit
Admission: RE | Admit: 2022-04-27 | Discharge: 2022-04-27 | Disposition: A | Discharge status: Home / Self Care | Payer: 59 | Source: Ambulatory Visit | Attending: Family | Admitting: Family

## 2022-04-27 DIAGNOSIS — Z1231 Encounter for screening mammogram for malignant neoplasm of breast: Secondary | ICD-10-CM | POA: Diagnosis not present

## 2022-05-12 ENCOUNTER — Other Ambulatory Visit
Admission: RE | Admit: 2022-05-12 | Discharge: 2022-05-12 | Disposition: A | Discharge status: Home / Self Care | Payer: 59 | Attending: Family | Admitting: Family

## 2022-05-12 DIAGNOSIS — Z0000 Encounter for general adult medical examination without abnormal findings: Secondary | ICD-10-CM | POA: Insufficient documentation

## 2022-05-12 DIAGNOSIS — R5383 Other fatigue: Secondary | ICD-10-CM | POA: Diagnosis not present

## 2022-05-12 LAB — LIPID PANEL
Cholesterol: 229 mg/dL — ABNORMAL HIGH (ref 0–200)
HDL: 39 mg/dL — ABNORMAL LOW (ref >40)
LDL Cholesterol: 164 mg/dL — ABNORMAL HIGH (ref 0–99)
Total CHOL/HDL Ratio: 5.9 RATIO
Triglycerides: 132 mg/dL (ref <150)
VLDL: 26 mg/dL (ref 0–40)

## 2022-05-12 LAB — COMPREHENSIVE METABOLIC PANEL
ALT: 25 U/L (ref 0–44)
AST: 24 U/L (ref 15–41)
Albumin: 4.5 g/dL (ref 3.5–5.0)
Alkaline Phosphatase: 80 U/L (ref 38–126)
Anion gap: 10 (ref 5–15)
BUN: 25 mg/dL — ABNORMAL HIGH (ref 6–20)
CO2: 24 mmol/L (ref 22–32)
Calcium: 9.1 mg/dL (ref 8.9–10.3)
Chloride: 102 mmol/L (ref 98–111)
Creatinine, Ser: 0.82 mg/dL (ref 0.44–1.00)
GFR, Estimated: >60 mL/min (ref >60)
Glucose, Bld: 97 mg/dL (ref 70–99)
Potassium: 3.9 mmol/L (ref 3.5–5.1)
Sodium: 136 mmol/L (ref 135–145)
Total Bilirubin: 0.8 mg/dL (ref 0.3–1.2)
Total Protein: 8.2 g/dL — ABNORMAL HIGH (ref 6.5–8.1)

## 2022-05-12 LAB — CBC WITH DIFFERENTIAL/PLATELET
Abs Immature Granulocytes: 0.03 10*3/uL (ref 0.00–0.07)
Basophils Absolute: 0.1 10*3/uL (ref 0.0–0.1)
Basophils Relative: 1 %
Eosinophils Absolute: 0.2 10*3/uL (ref 0.0–0.5)
Eosinophils Relative: 2 %
HCT: 39.7 % (ref 36.0–46.0)
Hemoglobin: 13.2 g/dL (ref 12.0–15.0)
Immature Granulocytes: 0 %
Lymphocytes Relative: 38 %
Lymphs Abs: 2.7 10*3/uL (ref 0.7–4.0)
MCH: 29.7 pg (ref 26.0–34.0)
MCHC: 33.2 g/dL (ref 30.0–36.0)
MCV: 89.4 fL (ref 80.0–100.0)
Monocytes Absolute: 0.5 10*3/uL (ref 0.1–1.0)
Monocytes Relative: 7 %
Neutro Abs: 3.6 10*3/uL (ref 1.7–7.7)
Neutrophils Relative %: 52 %
Platelets: 295 10*3/uL (ref 150–400)
RBC: 4.44 MIL/uL (ref 3.87–5.11)
RDW: 12.4 % (ref 11.5–15.5)
WBC: 7 10*3/uL (ref 4.0–10.5)
nRBC: 0 % (ref 0.0–0.2)

## 2022-05-12 LAB — HEMOGLOBIN A1C
Hgb A1c MFr Bld: 5.8 % — ABNORMAL HIGH (ref 4.8–5.6)
Mean Plasma Glucose: 119.76 mg/dL

## 2022-05-12 LAB — TSH: TSH: 1.135 u[IU]/mL (ref 0.350–4.500)

## 2022-05-12 NOTE — Addendum Note (Signed)
Addended by: Milbert Coulter on: 05/12/2022 07:36 AM   Modules accepted: Orders

## 2022-05-12 NOTE — Addendum Note (Signed)
Addended by: Shamyra Farias H on: 05/12/2022 07:37 AM   Modules accepted: Orders  

## 2022-05-12 NOTE — Addendum Note (Signed)
Addended by: Dessiree Sze H on: 05/12/2022 07:36 AM   Modules accepted: Orders  

## 2022-05-12 NOTE — Addendum Note (Signed)
Addended by: Rindy Kollman H on: 05/12/2022 07:36 AM   Modules accepted: Orders  

## 2022-05-12 NOTE — Addendum Note (Signed)
Addended by: Juliahna Wiswell H on: 05/12/2022 07:36 AM   Modules accepted: Orders  

## 2022-05-12 NOTE — Addendum Note (Signed)
Addended by: Milbert Coulter on: 05/12/2022 07:37 AM   Modules accepted: Orders

## 2022-05-13 LAB — MISC LABCORP TEST (SEND OUT): Labcorp test code: 81950

## 2022-05-16 ENCOUNTER — Ambulatory Visit (INDEPENDENT_AMBULATORY_CARE_PROVIDER_SITE_OTHER): Payer: 59 | Admitting: Family

## 2022-05-16 ENCOUNTER — Encounter: Payer: Self-pay | Admitting: Family

## 2022-05-16 VITALS — BP 118/76 | HR 76 | Temp 98.0°F | Ht 64.0 in | Wt 156.2 lb

## 2022-05-16 DIAGNOSIS — Z8639 Personal history of other endocrine, nutritional and metabolic disease: Secondary | ICD-10-CM

## 2022-05-16 DIAGNOSIS — Z0000 Encounter for general adult medical examination without abnormal findings: Secondary | ICD-10-CM | POA: Diagnosis not present

## 2022-05-16 DIAGNOSIS — E782 Mixed hyperlipidemia: Secondary | ICD-10-CM

## 2022-05-16 NOTE — Assessment & Plan Note (Signed)
LDL cholesterol increased from prior. She has family history of heart disease.  CT calcium score of 0 as obtained by Dr Rockey Situ, 05/2021.  We agreed to continue to monitor.

## 2022-05-16 NOTE — Progress Notes (Signed)
Assessment & Plan:  History of vitamin D deficiency -     VITAMIN D 25 Hydroxy (Vit-D Deficiency, Fractures); Future  Mixed hyperlipidemia Assessment & Plan: LDL cholesterol increased from prior. She has family history of heart disease.  CT calcium score of 0 as obtained by Dr Rockey Situ, 05/2021.  We agreed to continue to monitor.   Routine physical examination Assessment & Plan: History of hysterectomy for noncancerous reason.Pap smear of vagina  obtained 03/29/2021.  Negative for HPV, malignancy.   Discontinue cervical cancer screening in the absence of cervix.  Encouraged continued exercise.  Mammogram is up-to-date.      Return precautions given.   Risks, benefits, and alternatives of the medications and treatment plan prescribed today were discussed, and patient expressed understanding.   Education regarding symptom management and diagnosis given to patient on AVS either electronically or printed.  No follow-ups on file.  Mable Paris, FNP  Subjective:    Patient ID: Tammy Chang, female    DOB: November 21, 1960, 62 y.o.   MRN: [id number]  CC: BEATRYCE COLOMBO is a 62 y.o. female who presents today for physical exam.    HPI: Feels well today.  No new complaints.  She has recently started walking daily with a colleague at work.   She has family history of heart disease.  CT calcium score of 0 , 05/2021.  Colorectal Cancer Screening: UTD , 07/03/2017, Dr. Vicente Males, repeat 10 years Breast Cancer Screening: Mammogram UTD Cervical Cancer Screening: History of hysterectomy done for endometriosis, no cancer, believes one has ovary.  Pap smear of vagina  obtained 03/29/2021.  Negative for HPV, malignancy  Bone Health screening/DEXA for 65+: h/o osteopenia  Lung Cancer Screening: Doesn't have 20 year pack year history and age > 74 years yo 58 years        Tetanus - UTD       HIV Screening- Candidate for , done 02/22/2016 Labs: Screening labs done prior.  Exercise: Gets regular  exercise.   Alcohol use:  none Smoking/tobacco use: Nonsmoker.    Health Maintenance  Topic Date Due   COVID-19 Vaccine (3 - 2023-24 season) 12/10/2021   MAMMOGRAM  04/27/2024   COLONOSCOPY (Pts 45-16yrs Insurance coverage will need to be confirmed)  07/04/2027   DTaP/Tdap/Td (2 - Td or Tdap) 08/26/2027   INFLUENZA VACCINE  Completed   Hepatitis C Screening  Completed   Zoster Vaccines- Shingrix  Completed   HPV VACCINES  Aged Out   PAP SMEAR-Modifier  Discontinued   HIV Screening  Discontinued    ALLERGIES: Morphine and related  Current Outpatient Medications on File Prior to Visit  Medication Sig Dispense Refill   b complex vitamins capsule Take 1 capsule by mouth daily.     Cholecalciferol (VITAMIN D3) 2000 units TABS Take by mouth daily.     benzonatate (TESSALON) 100 MG capsule Take 1 capsule (100 mg total) by mouth 3 (three) times daily as needed for cough. 30 capsule 0   fluticasone (FLONASE) 50 MCG/ACT nasal spray Place 2 sprays into both nostrils daily. 16 g 6   No current facility-administered medications on file prior to visit.    Review of Systems  Constitutional:  Negative for chills, fever and unexpected weight change.  HENT:  Negative for congestion.   Respiratory:  Negative for cough.   Cardiovascular:  Negative for chest pain, palpitations and leg swelling.  Gastrointestinal:  Negative for nausea and vomiting.  Musculoskeletal:  Negative for arthralgias and myalgias.  Skin:  Negative for rash.  Neurological:  Negative for headaches.  Hematological:  Negative for adenopathy.  Psychiatric/Behavioral:  Negative for confusion.       Objective:    BP 118/76   Pulse 76   Temp 98 F (36.7 C) (Oral)   Ht 5\' 4"  (1.626 m)   Wt 156 lb 3.2 oz (70.9 kg)   SpO2 98%   BMI 26.81 kg/m   BP Readings from Last 3 Encounters:  05/16/22 118/76  05/17/21 104/78  03/29/21 118/84   Wt Readings from Last 3 Encounters:  05/16/22 156 lb 3.2 oz (70.9 kg)  05/17/21  155 lb 8 oz (70.5 kg)  03/29/21 155 lb 6.4 oz (70.5 kg)    Physical Exam Vitals reviewed.  Constitutional:      Appearance: She is well-developed.  Eyes:     Conjunctiva/sclera: Conjunctivae normal.  Neck:     Thyroid: No thyroid mass or thyromegaly.  Cardiovascular:     Rate and Rhythm: Normal rate and regular rhythm.     Pulses: Normal pulses.     Heart sounds: Normal heart sounds.  Pulmonary:     Effort: Pulmonary effort is normal.     Breath sounds: Normal breath sounds. No wheezing, rhonchi or rales.  Lymphadenopathy:     Head:     Right side of head: No submental, submandibular, tonsillar, preauricular, posterior auricular or occipital adenopathy.     Left side of head: No submental, submandibular, tonsillar, preauricular, posterior auricular or occipital adenopathy.     Cervical: No cervical adenopathy.  Skin:    General: Skin is warm and dry.  Neurological:     Mental Status: She is alert.  Psychiatric:        Speech: Speech normal.        Behavior: Behavior normal.        Thought Content: Thought content normal.

## 2022-05-16 NOTE — Assessment & Plan Note (Signed)
History of hysterectomy for noncancerous reason.Pap smear of vagina  obtained 03/29/2021.  Negative for HPV, malignancy.   Discontinue cervical cancer screening in the absence of cervix.  Encouraged continued exercise.  Mammogram is up-to-date.

## 2023-06-06 ENCOUNTER — Other Ambulatory Visit: Payer: Self-pay

## 2023-06-06 ENCOUNTER — Telehealth: Payer: 59 | Admitting: Physician Assistant

## 2023-06-06 DIAGNOSIS — J208 Acute bronchitis due to other specified organisms: Secondary | ICD-10-CM

## 2023-06-06 DIAGNOSIS — B9689 Other specified bacterial agents as the cause of diseases classified elsewhere: Secondary | ICD-10-CM

## 2023-06-06 MED ORDER — DOXYCYCLINE HYCLATE 100 MG PO TABS
100.0000 mg | ORAL_TABLET | Freq: Two times a day (BID) | ORAL | 0 refills | Status: DC
  Filled 2023-06-06: qty 14, 7d supply, fill #0

## 2023-06-06 MED ORDER — PROMETHAZINE-DM 6.25-15 MG/5ML PO SYRP
5.0000 mL | ORAL_SOLUTION | Freq: Four times a day (QID) | ORAL | 0 refills | Status: DC | PRN
  Filled 2023-06-06: qty 118, 6d supply, fill #0

## 2023-06-06 NOTE — Progress Notes (Signed)
 I have spent 5 minutes in review of e-visit questionnaire, review and updating patient chart, medical decision making and response to patient.   Piedad Climes, PA-C

## 2023-06-06 NOTE — Progress Notes (Signed)
 E-Visit for Cough   We are sorry that you are not feeling well.  Here is how we plan to help!  Based on your presentation I believe you most likely have A cough due to bacteria.  When patients have a productive cough with a change in color or increased sputum production, we are concerned about bacterial bronchitis.  If left untreated it can progress to pneumonia.  If your symptoms do not improve with your treatment plan it is important that you contact your provider.   I have prescribed Doxycycline.    In addition I have prescribed promethazine-dm cough syrup to use as directed.  From your responses in the eVisit questionnaire you describe inflammation in the upper respiratory tract which is causing a significant cough.  This is commonly called Bronchitis and has four common causes:   Allergies Viral Infections Acid Reflux Bacterial Infection Allergies, viruses and acid reflux are treated by controlling symptoms or eliminating the cause. An example might be a cough caused by taking certain blood pressure medications. You stop the cough by changing the medication. Another example might be a cough caused by acid reflux. Controlling the reflux helps control the cough.  USE OF BRONCHODILATOR ("RESCUE") INHALERS: There is a risk from using your bronchodilator too frequently.  The risk is that over-reliance on a medication which only relaxes the muscles surrounding the breathing tubes can reduce the effectiveness of medications prescribed to reduce swelling and congestion of the tubes themselves.  Although you feel brief relief from the bronchodilator inhaler, your asthma may actually be worsening with the tubes becoming more swollen and filled with mucus.  This can delay other crucial treatments, such as oral steroid medications. If you need to use a bronchodilator inhaler daily, several times per day, you should discuss this with your provider.  There are probably better treatments that could be used to  keep your asthma under control.     HOME CARE Only take medications as instructed by your medical team. Complete the entire course of an antibiotic. Drink plenty of fluids and get plenty of rest. Avoid close contacts especially the very young and the elderly Cover your mouth if you cough or cough into your sleeve. Always remember to wash your hands A steam or ultrasonic humidifier can help congestion.   GET HELP RIGHT AWAY IF: You develop worsening fever. You become short of breath You cough up blood. Your symptoms persist after you have completed your treatment plan MAKE SURE YOU  Understand these instructions. Will watch your condition. Will get help right away if you are not doing well or get worse.    Thank you for choosing an e-visit.  Your e-visit answers were reviewed by a board certified advanced clinical practitioner to complete your personal care plan. Depending upon the condition, your plan could have included both over the counter or prescription medications.  Please review your pharmacy choice. Make sure the pharmacy is open so you can pick up prescription now. If there is a problem, you may contact your provider through [employer] and have the prescription routed to another pharmacy.  Your safety is important to [place]. If you have drug allergies check your prescription carefully.   For the next 24 hours you can use MyChart to ask questions about today's visit, request a non-urgent call back, or ask for a work or school excuse. You will get an email in the next two days asking about your experience. I hope that your e-visit has been  valuable and will speed your recovery.

## 2023-06-20 ENCOUNTER — Telehealth: Payer: Self-pay

## 2023-06-20 ENCOUNTER — Encounter: Payer: Self-pay | Admitting: Family

## 2023-06-20 ENCOUNTER — Other Ambulatory Visit: Payer: Self-pay

## 2023-06-20 DIAGNOSIS — Z1231 Encounter for screening mammogram for malignant neoplasm of breast: Secondary | ICD-10-CM

## 2023-06-20 DIAGNOSIS — Z0000 Encounter for general adult medical examination without abnormal findings: Secondary | ICD-10-CM

## 2023-06-20 DIAGNOSIS — E785 Hyperlipidemia, unspecified: Secondary | ICD-10-CM

## 2023-06-20 NOTE — Telephone Encounter (Signed)
 I left a voicemail for patient asking her to please call us back to reschedule her 07/20/2023 appointment with Rennie Plowman, FNP, as she will not be in the office at that time.  When patient calls back, please reschedule this visit.

## 2023-06-20 NOTE — Telephone Encounter (Signed)
 Copied from CRM [id number]. Topic: Appointments - Appointment Scheduling >> Jun 20, 2023 12:28 PM Alcus Dad wrote: Patient/patient representative is calling to schedule an appointment. Need orders turned in for Labs and Mammogram in [place]

## 2023-06-20 NOTE — Telephone Encounter (Signed)
 Spoke to pt  appt scheduled and put in orders for labs and mammogram

## 2023-07-05 ENCOUNTER — Ambulatory Visit
Admission: RE | Admit: 2023-07-05 | Discharge: 2023-07-05 | Disposition: A | Discharge status: Home / Self Care | Source: Ambulatory Visit | Attending: Family | Admitting: Family

## 2023-07-05 DIAGNOSIS — Z1231 Encounter for screening mammogram for malignant neoplasm of breast: Secondary | ICD-10-CM | POA: Insufficient documentation

## 2023-07-13 ENCOUNTER — Other Ambulatory Visit
Admission: RE | Admit: 2023-07-13 | Discharge: 2023-07-13 | Disposition: A | Discharge status: Home / Self Care | Attending: Family | Admitting: Family

## 2023-07-13 DIAGNOSIS — E785 Hyperlipidemia, unspecified: Secondary | ICD-10-CM | POA: Diagnosis not present

## 2023-07-13 DIAGNOSIS — Z0000 Encounter for general adult medical examination without abnormal findings: Secondary | ICD-10-CM | POA: Diagnosis not present

## 2023-07-13 LAB — COMPREHENSIVE METABOLIC PANEL WITH GFR
ALT: 24 U/L (ref 0–44)
AST: 19 U/L (ref 15–41)
Albumin: 4.6 g/dL (ref 3.5–5.0)
Alkaline Phosphatase: 67 U/L (ref 38–126)
Anion gap: 9 (ref 5–15)
BUN: 20 mg/dL (ref 8–23)
CO2: 24 mmol/L (ref 22–32)
Calcium: 9.2 mg/dL (ref 8.9–10.3)
Chloride: 103 mmol/L (ref 98–111)
Creatinine, Ser: 0.95 mg/dL (ref 0.44–1.00)
GFR, Estimated: >60 mL/min (ref >60)
Glucose, Bld: 103 mg/dL — ABNORMAL HIGH (ref 70–99)
Potassium: 4.4 mmol/L (ref 3.5–5.1)
Sodium: 136 mmol/L (ref 135–145)
Total Bilirubin: 0.4 mg/dL (ref 0.0–1.2)
Total Protein: 8.2 g/dL — ABNORMAL HIGH (ref 6.5–8.1)

## 2023-07-13 LAB — CBC WITH DIFFERENTIAL/PLATELET
Abs Immature Granulocytes: 0.01 10*3/uL (ref 0.00–0.07)
Basophils Absolute: 0 10*3/uL (ref 0.0–0.1)
Basophils Relative: 1 %
Eosinophils Absolute: 0.2 10*3/uL (ref 0.0–0.5)
Eosinophils Relative: 3 %
HCT: 40.3 % (ref 36.0–46.0)
Hemoglobin: 13.5 g/dL (ref 12.0–15.0)
Immature Granulocytes: 0 %
Lymphocytes Relative: 30 %
Lymphs Abs: 2 10*3/uL (ref 0.7–4.0)
MCH: 29.2 pg (ref 26.0–34.0)
MCHC: 33.5 g/dL (ref 30.0–36.0)
MCV: 87.2 fL (ref 80.0–100.0)
Monocytes Absolute: 0.4 10*3/uL (ref 0.1–1.0)
Monocytes Relative: 6 %
Neutro Abs: 3.9 10*3/uL (ref 1.7–7.7)
Neutrophils Relative %: 60 %
Platelets: 291 10*3/uL (ref 150–400)
RBC: 4.62 MIL/uL (ref 3.87–5.11)
RDW: 12.8 % (ref 11.5–15.5)
WBC: 6.4 10*3/uL (ref 4.0–10.5)
nRBC: 0 % (ref 0.0–0.2)

## 2023-07-13 LAB — TSH: TSH: 1.252 u[IU]/mL (ref 0.350–4.500)

## 2023-07-13 LAB — LIPID PANEL
Cholesterol: 214 mg/dL — ABNORMAL HIGH (ref 0–200)
HDL: 40 mg/dL — ABNORMAL LOW (ref >40)
LDL Cholesterol: 154 mg/dL — ABNORMAL HIGH (ref 0–99)
Total CHOL/HDL Ratio: 5.4 ratio
Triglycerides: 101 mg/dL (ref <150)
VLDL: 20 mg/dL (ref 0–40)

## 2023-07-13 LAB — HEMOGLOBIN A1C
Hgb A1c MFr Bld: 5.5 % (ref 4.8–5.6)
Mean Plasma Glucose: 111.15 mg/dL

## 2023-07-17 ENCOUNTER — Encounter: Payer: 59 | Admitting: Family

## 2023-07-18 ENCOUNTER — Encounter: Payer: Self-pay | Admitting: Family

## 2023-07-18 ENCOUNTER — Ambulatory Visit (INDEPENDENT_AMBULATORY_CARE_PROVIDER_SITE_OTHER): Admitting: Family

## 2023-07-18 VITALS — BP 117/78 | HR 76 | Temp 97.6°F | Ht 64.0 in | Wt 153.4 lb

## 2023-07-18 DIAGNOSIS — Z0000 Encounter for general adult medical examination without abnormal findings: Secondary | ICD-10-CM

## 2023-07-18 DIAGNOSIS — R778 Other specified abnormalities of plasma proteins: Secondary | ICD-10-CM

## 2023-07-18 NOTE — Patient Instructions (Signed)
 Health Maintenance for Postmenopausal Women Menopause is a normal process in which your ability to get pregnant comes to an end. This process happens slowly over many months or years, usually between the ages of 24 and 62. Menopause is complete when you have missed your menstrual period for 12 months. It is important to talk with your health care provider about some of the most common conditions that affect women after menopause (postmenopausal women). These include heart disease, cancer, and bone loss (osteoporosis). Adopting a healthy lifestyle and getting preventive care can help to promote your health and wellness. The actions you take can also lower your chances of developing some of these common conditions. What are the signs and symptoms of menopause? During menopause, you may have the following symptoms: Hot flashes. These can be moderate or severe. Night sweats. Decrease in sex drive. Mood swings. Headaches. Tiredness (fatigue). Irritability. Memory problems. Problems falling asleep or staying asleep. Talk with your health care provider about treatment options for your symptoms. Do I need hormone replacement therapy? Hormone replacement therapy is effective in treating symptoms that are caused by menopause, such as hot flashes and night sweats. Hormone replacement carries certain risks, especially as you become older. If you are thinking about using estrogen or estrogen with progestin, discuss the benefits and risks with your health care provider. How can I reduce my risk for heart disease and stroke? The risk of heart disease, heart attack, and stroke increases as you age. One of the causes may be a change in the body's hormones during menopause. This can affect how your body uses dietary fats, triglycerides, and cholesterol. Heart attack and stroke are medical emergencies. There are many things that you can do to help prevent heart disease and stroke. Watch your blood pressure High  blood pressure causes heart disease and increases the risk of stroke. This is more likely to develop in people who have high blood pressure readings or are overweight. Have your blood pressure checked: Every 3-5 years if you are 50-75 years of age. Every year if you are 77 years old or older. Eat a healthy diet  Eat a diet that includes plenty of vegetables, fruits, low-fat dairy products, and lean protein. Do not eat a lot of foods that are high in solid fats, added sugars, or sodium. Get regular exercise Get regular exercise. This is one of the most important things you can do for your health. Most adults should: Try to exercise for at least 150 minutes each week. The exercise should increase your heart rate and make you sweat (moderate-intensity exercise). Try to do strengthening exercises at least twice each week. Do these in addition to the moderate-intensity exercise. Spend less time sitting. Even light physical activity can be beneficial. Other tips Work with your health care provider to achieve or maintain a healthy weight. Do not use any products that contain nicotine or tobacco. These products include cigarettes, chewing tobacco, and vaping devices, such as e-cigarettes. If you need help quitting, ask your health care provider. Know your numbers. Ask your health care provider to check your cholesterol and your blood sugar (glucose). Continue to have your blood tested as directed by your health care provider. Do I need screening for cancer? Depending on your health history and family history, you may need to have cancer screenings at different stages of your life. This may include screening for: Breast cancer. Cervical cancer. Lung cancer. Colorectal cancer. What is my risk for osteoporosis? After menopause, you may be  at increased risk for osteoporosis. Osteoporosis is a condition in which bone destruction happens more quickly than new bone creation. To help prevent osteoporosis or  the bone fractures that can happen because of osteoporosis, you may take the following actions: If you are 61-3 years old, get at least 1,000 mg of calcium and at least 600 international units (IU) of vitamin D per day. If you are older than age 61 but younger than age 75, get at least 1,200 mg of calcium and at least 600 international units (IU) of vitamin D per day. If you are older than age 62, get at least 1,200 mg of calcium and at least 800 international units (IU) of vitamin D per day. Smoking and drinking excessive alcohol increase the risk of osteoporosis. Eat foods that are rich in calcium and vitamin D, and do weight-bearing exercises several times each week as directed by your health care provider. How does menopause affect my mental health? Depression may occur at any age, but it is more common as you become older. Common symptoms of depression include: Feeling depressed. Changes in sleep patterns. Changes in appetite or eating patterns. Feeling an overall lack of motivation or enjoyment of activities that you previously enjoyed. Frequent crying spells. Talk with your health care provider if you think that you are experiencing any of these symptoms. General instructions See your health care provider for regular wellness exams and vaccines. This may include: Scheduling regular health, dental, and eye exams. Getting and maintaining your vaccines. These include: Influenza vaccine. Get this vaccine each year before the flu season begins. Pneumonia vaccine. Shingles vaccine. Tetanus, diphtheria, and pertussis (Tdap) booster vaccine. Your health care provider may also recommend other immunizations. Tell your health care provider if you have ever been abused or do not feel safe at home. Summary Menopause is a normal process in which your ability to get pregnant comes to an end. This condition causes hot flashes, night sweats, decreased interest in sex, mood swings, headaches, or lack  of sleep. Treatment for this condition may include hormone replacement therapy. Take actions to keep yourself healthy, including exercising regularly, eating a healthy diet, watching your weight, and checking your blood pressure and blood sugar levels. Get screened for cancer and depression. Make sure that you are up to date with all your vaccines. This information is not intended to replace advice given to you by your health care provider. Make sure you discuss any questions you have with your health care provider. Document Revised: 08/17/2020 Document Reviewed: 08/17/2020 Elsevier Patient Education  2024 [employer].

## 2023-07-18 NOTE — Progress Notes (Addendum)
 Assessment & Plan:  Routine physical examination Assessment & Plan: Deferred clinical breast exam due to patient preference.  Mammogram is up-to-date.  Deferred pelvic exam  in the absence of complaints and she does not have a cervix.  We are no longer screening for cervical cancer.  Reviewed lab work with patient today.     Elevated total protein Assessment & Plan: Chronic, stable.  Reviewed previous multiple myeloma screen 03/2018, collaboration with Dr Racheal Patches.  No unusual bone pain, unusual weight loss.  No evidence of CKD.  Will continue to monitor      Return precautions given.   Risks, benefits, and alternatives of the medications and treatment plan prescribed today were discussed, and patient expressed understanding.   Education regarding symptom management and diagnosis given to patient on AVS either electronically or printed.  Return for Complete Physical Exam.  Rennie Plowman, FNP  Subjective:    Patient ID: Tammy Chang, female    DOB: 25-Mar-1961, 63 y.o.   MRN: [id number]  CC: Tammy Chang is a 63 y.o. female who presents today for physical exam.    HPI: Feels well today.  No new complaints.  She is walking daily with a colleague.  She is limiting bread and sugar intentionally to lower blood sugar.   denies bone pain, unusual weight loss  BP at home 117/78    She follows annually with dermatology  Labs done prior.   Colorectal Cancer Screening: UTD , 06/2017, repeat in 10 years Dr Tobi Bastos Breast Cancer Screening: Mammogram UTD 06/2023 Cervical Cancer Screening: No longer screening for cervical cancer; history of hysterectomy.  No history of GYN cancer. no cervix 03/2021 on my exam. Pap smear vaginal wall negative HPV, negative malignancy  Bone Health screening/DEXA for 65+: No increased fracture risk. Defer screening at this time.       Tetanus - UTD Exercise: Gets regular exercise.   Alcohol use:  none Smoking/tobacco use: Nonsmoker.    Health  Maintenance  Topic Date Due   Pap Smear  03/29/2022   COVID-19 Vaccine (3 - 2024-25 season) 12/11/2022   Flu Shot  11/10/2023   Mammogram  07/04/2025   Colon Cancer Screening  07/04/2027   DTaP/Tdap/Td vaccine (2 - Td or Tdap) 08/26/2027   Hepatitis C Screening  Completed   Zoster (Shingles) Vaccine  Completed   HPV Vaccine  Aged Out   HIV Screening  Discontinued    ALLERGIES: Morphine and codeine  Current Outpatient Medications on File Prior to Visit  Medication Sig Dispense Refill   b complex vitamins capsule Take 1 capsule by mouth daily.     Cholecalciferol (VITAMIN D3) 2000 units TABS Take by mouth daily.     doxycycline (VIBRA-TABS) 100 MG tablet Take 1 tablet (100 mg total) by mouth 2 (two) times daily. (Patient not taking: Reported on 07/18/2023) 14 tablet 0   promethazine-dextromethorphan (PROMETHAZINE-DM) 6.25-15 MG/5ML syrup Take 5 mLs by mouth 4 (four) times daily as needed for cough. (Patient not taking: Reported on 07/18/2023) 118 mL 0   No current facility-administered medications on file prior to visit.    Review of Systems  Constitutional:  Negative for chills, fatigue, fever and unexpected weight change.  HENT:  Negative for congestion.   Respiratory:  Negative for cough.   Cardiovascular:  Negative for chest pain, palpitations and leg swelling.  Gastrointestinal:  Negative for nausea and vomiting.  Musculoskeletal:  Negative for arthralgias, back pain and myalgias.  Skin:  Negative for rash.  Neurological:  Negative for headaches.  Hematological:  Negative for adenopathy.  Psychiatric/Behavioral:  Negative for confusion.       Objective:    BP 138/78   Pulse 76   Temp 97.6 F (36.4 C) (Oral)   Ht 5\' 4"  (1.626 m)   Wt 153 lb 6.4 oz (69.6 kg)   SpO2 96%   BMI 26.33 kg/m   BP Readings from Last 3 Encounters:  07/18/23 138/78  05/16/22 118/76  05/17/21 104/78   Wt Readings from Last 3 Encounters:  07/18/23 153 lb 6.4 oz (69.6 kg)  05/16/22 156 lb  3.2 oz (70.9 kg)  05/17/21 155 lb 8 oz (70.5 kg)    Physical Exam Vitals reviewed.  Constitutional:      Appearance: She is well-developed.  Eyes:     Conjunctiva/sclera: Conjunctivae normal.  Neck:     Thyroid: No thyroid mass or thyromegaly.  Cardiovascular:     Rate and Rhythm: Normal rate and regular rhythm.     Pulses: Normal pulses.     Heart sounds: Normal heart sounds.  Pulmonary:     Effort: Pulmonary effort is normal.     Breath sounds: Normal breath sounds. No wheezing, rhonchi or rales.  Lymphadenopathy:     Head:     Right side of head: No submental, submandibular, tonsillar, preauricular, posterior auricular or occipital adenopathy.     Left side of head: No submental, submandibular, tonsillar, preauricular, posterior auricular or occipital adenopathy.     Cervical: No cervical adenopathy.  Skin:    General: Skin is warm and dry.  Neurological:     Mental Status: She is alert.  Psychiatric:        Speech: Speech normal.        Behavior: Behavior normal.        Thought Content: Thought content normal.

## 2023-07-18 NOTE — Assessment & Plan Note (Signed)
 Deferred clinical breast exam due to patient preference.  Mammogram is up-to-date.  Deferred pelvic exam  in the absence of complaints and she does not have a cervix.  We are no longer screening for cervical cancer.  Reviewed lab work with patient today.

## 2023-07-18 NOTE — Assessment & Plan Note (Addendum)
 Chronic, stable.  Reviewed previous multiple myeloma screen 03/2018, collaboration with Dr Racheal Patches.  No unusual bone pain, unusual weight loss.  No evidence of CKD.  Will continue to monitor

## 2023-07-20 ENCOUNTER — Encounter: Payer: 59 | Admitting: Family

## 2023-10-11 IMAGING — MG MM DIGITAL SCREENING BILAT W/ TOMO AND CAD
8 series · 8 of 24 positions shown · non-contrast
Comparison: Previous exam(s).

CLINICAL DATA: Screening.

EXAM:
DIGITAL SCREENING BILATERAL MAMMOGRAM WITH TOMOSYNTHESIS AND CAD
TECHNIQUE: Bilateral screening digital craniocaudal and mediolateral oblique
mammograms were obtained. Bilateral screening digital breast
tomosynthesis was performed. The images were evaluated with
computer-aided detection.

[R CC synth-2D]
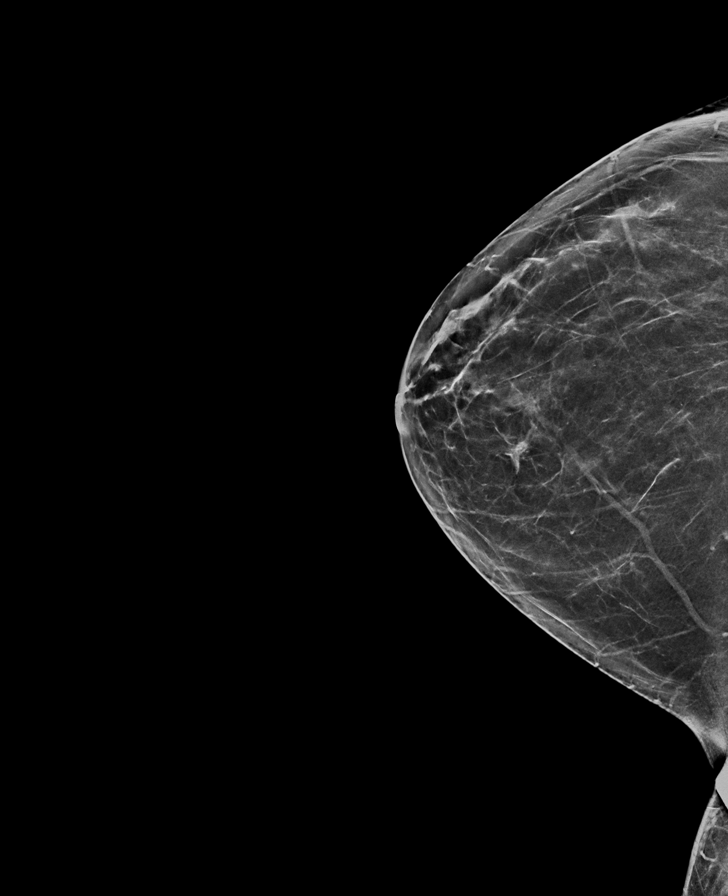

[L MLO synth-2D]
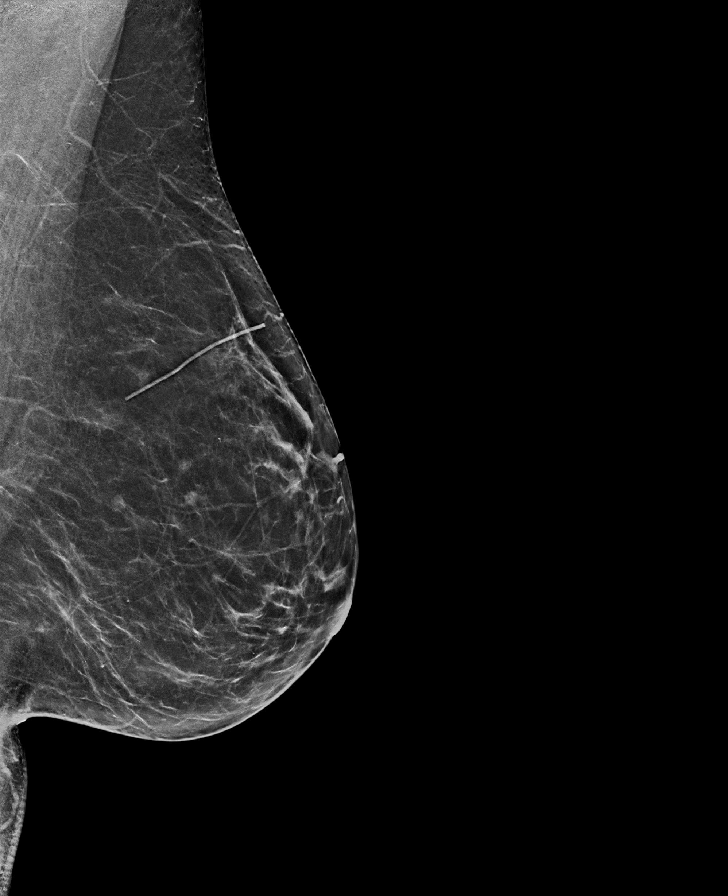

[R MLO synth-2D]
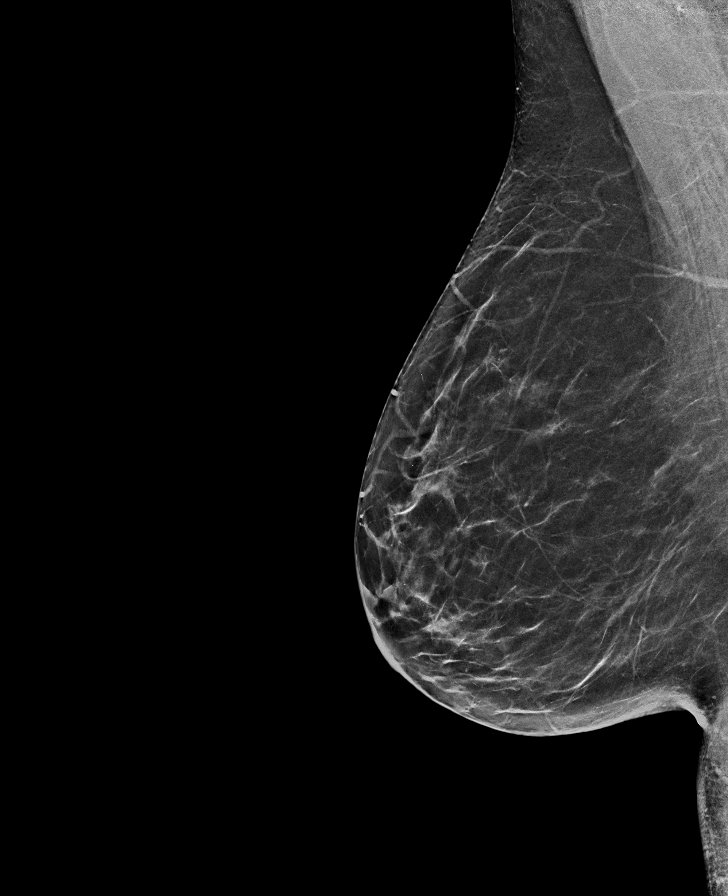

[L CC synth-2D]
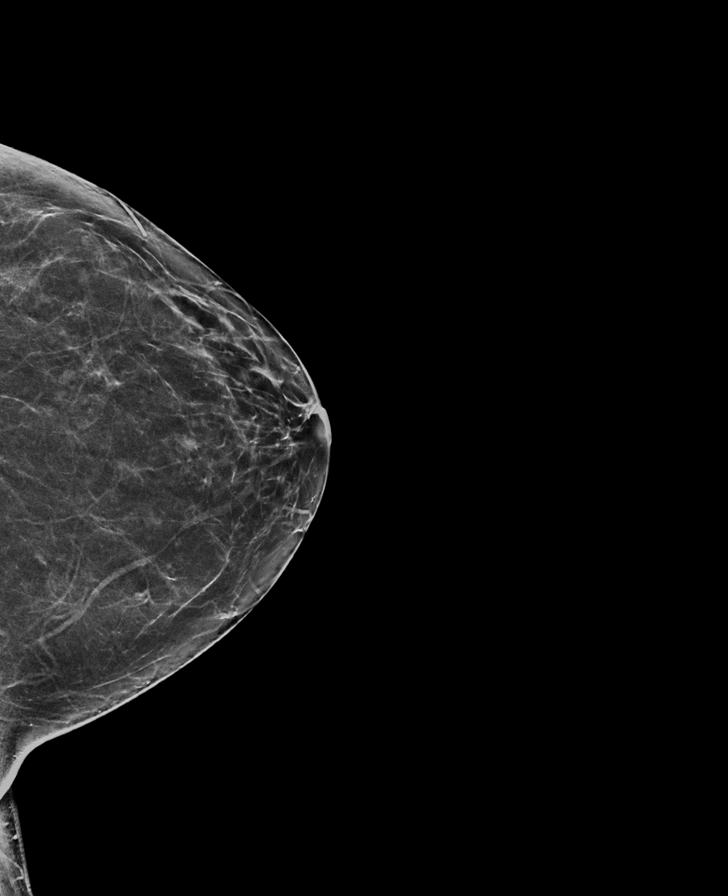

[R MLO tomo · tomo slice 33/66.0]
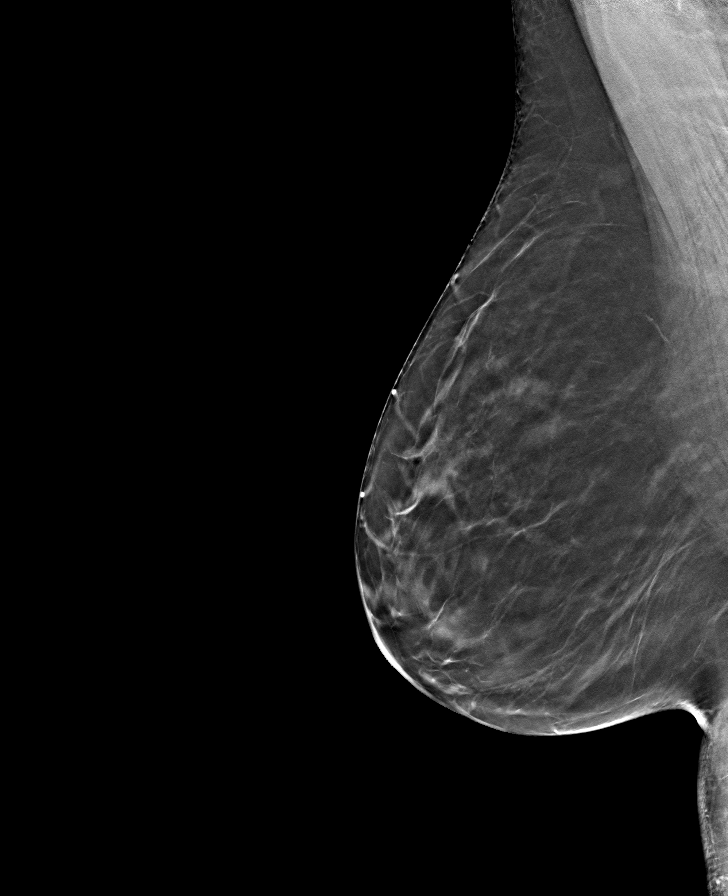

[L CC tomo · tomo slice 33/64.0]
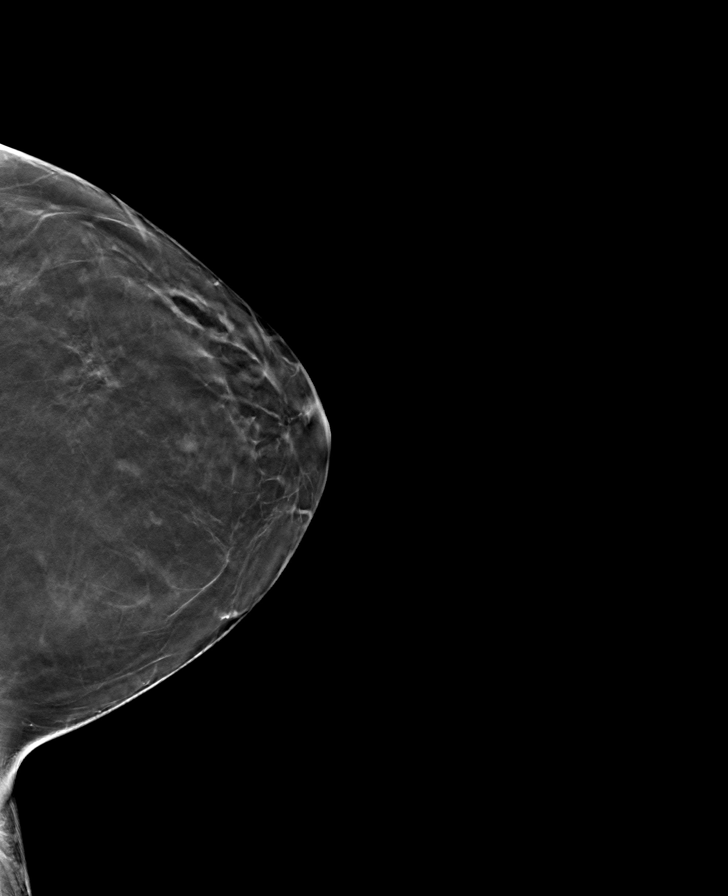

[L MLO tomo · tomo slice 33/64.0]
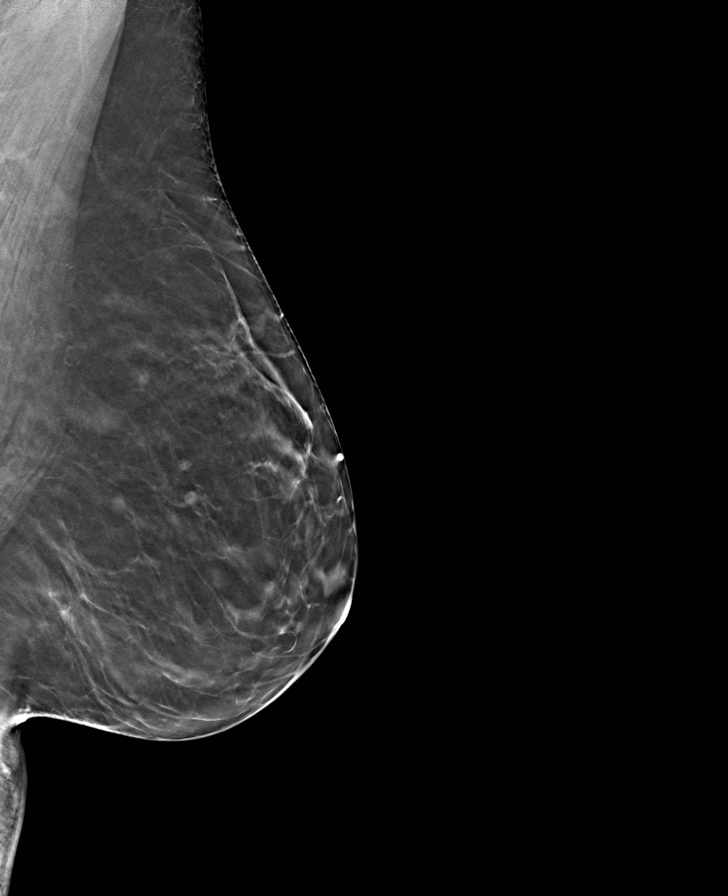

[R CC tomo · tomo slice 33/64.0]
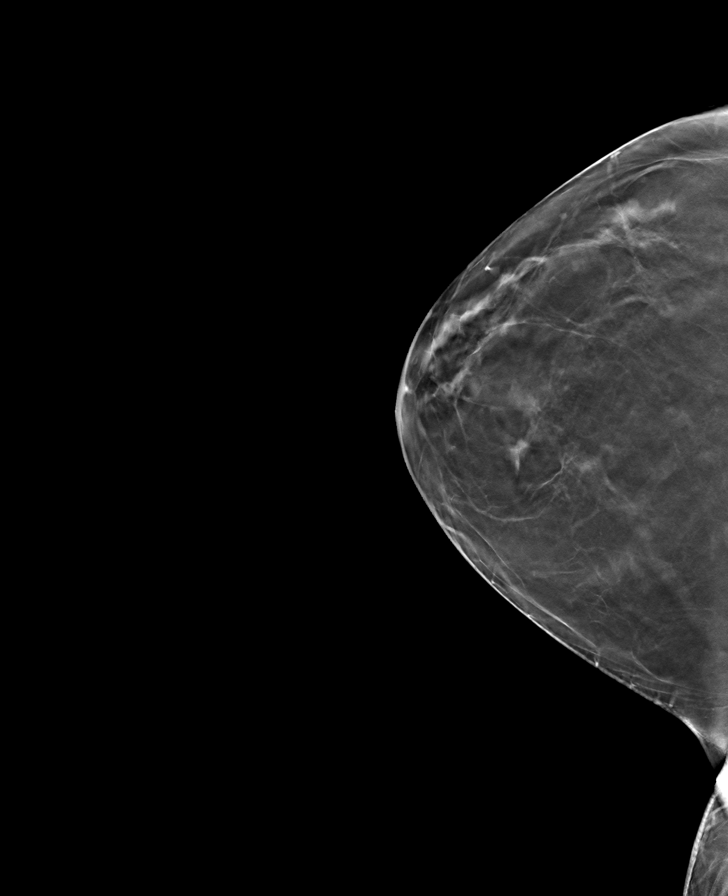

[8 of 24 positions shown; findings below may reference images not displayed]

ACR Breast Density Category b: There are scattered areas of
fibroglandular density.
FINDINGS: There are no findings suspicious for malignancy.
IMPRESSION: No mammographic evidence of malignancy. A result letter of this
screening mammogram will be mailed directly to the patient.

RECOMMENDATION:
Screening mammogram in one year. (Code:51-O-LD2)

BI-RADS CATEGORY  1: Negative.

## 2023-12-13 IMAGING — CT CT CARDIAC CORONARY ARTERY CALCIUM SCORE
3 series · 14 of 20 positions shown, 16 images · non-contrast
Comparison: None.
COMPARISON: None.

Addendum:
EXAM:
OVER-READ INTERPRETATION  CT CHEST

The following report is an over-read performed by radiologist Dr.
Felner Ceola [REDACTED] on 05/18/2021. This over-read
does not include interpretation of cardiac or coronary anatomy or
pathology. The coronary calcium score interpretation by the
cardiologist is attached.
CLINICAL DATA: Risk stratification
Coronary Calcium Score
TECHNIQUE: The patient was scanned on a Siemens Somatom go.Top Scanner. Axial
non-contrast 3 mm slices were carried out through the heart. The
data set was analyzed on a dedicated work station and scored using
the Agatson method.

[Series 2: sa36 calcium scoring 3.00 · axial · 0.32mm/px · z∈[-1044,-984]mm · 4 of 34 slices shown]
[im 7/34  vessel]
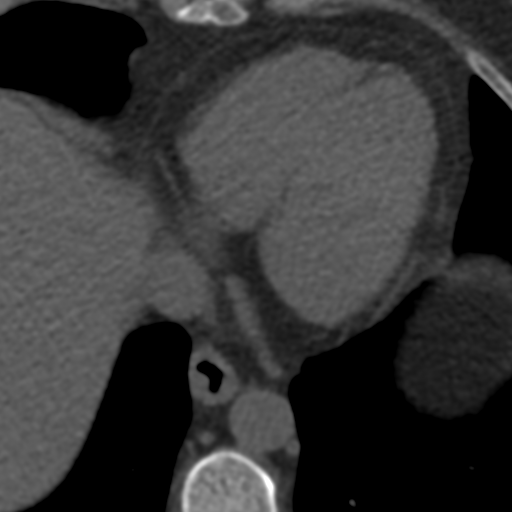
[im 14/34  vessel]
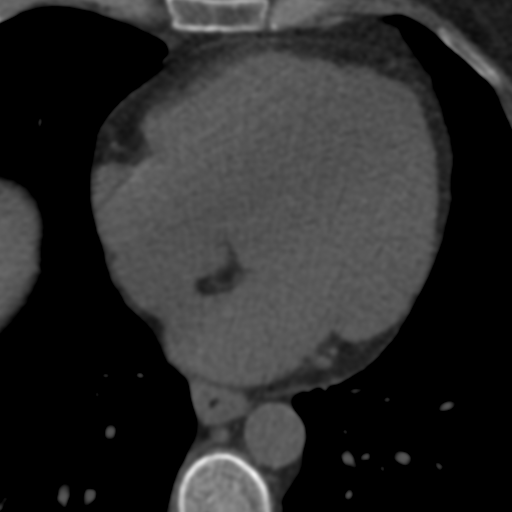
[im 20/34  vessel]
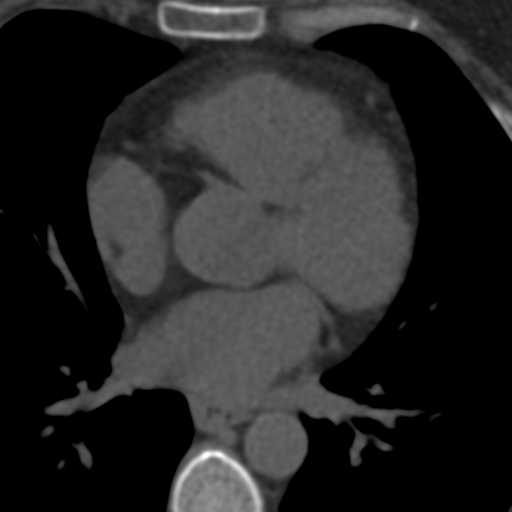
[im 27/34  vessel]
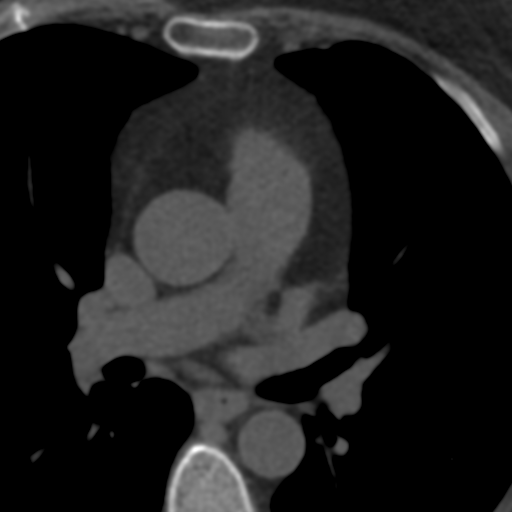

[Series 5: full fov st calcium scoring 3.00 · axial · 0.59mm/px · z∈[-1047,-981]mm · 5 of 34 slices shown, 7 images]
[im 6/34  vessel]
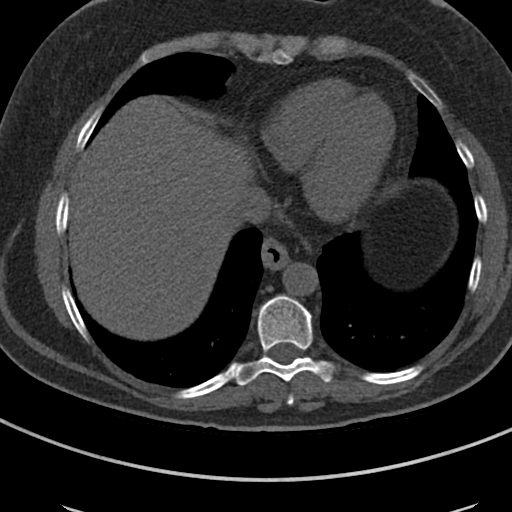
[im 6/34  lung]
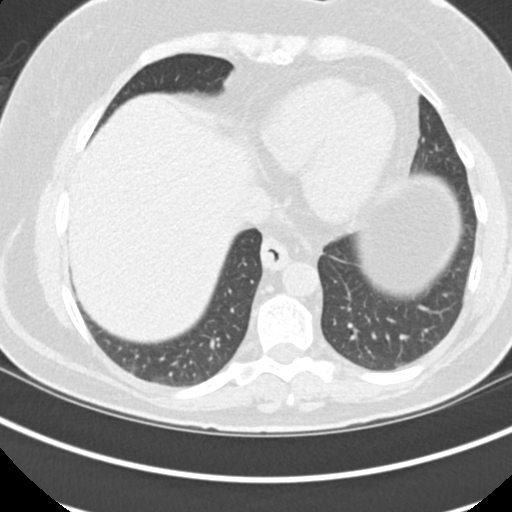
[im 12/34  vessel]
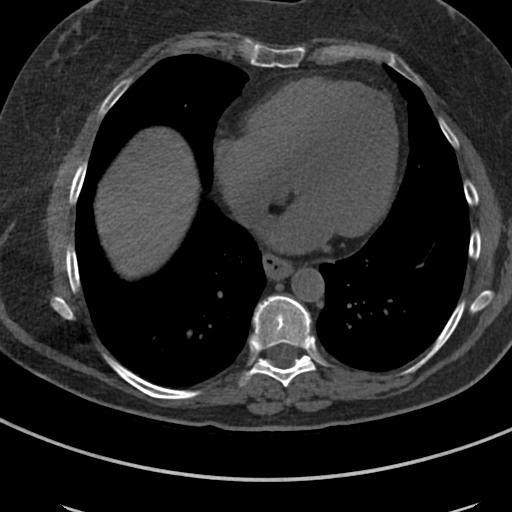
[im 17/34  vessel]
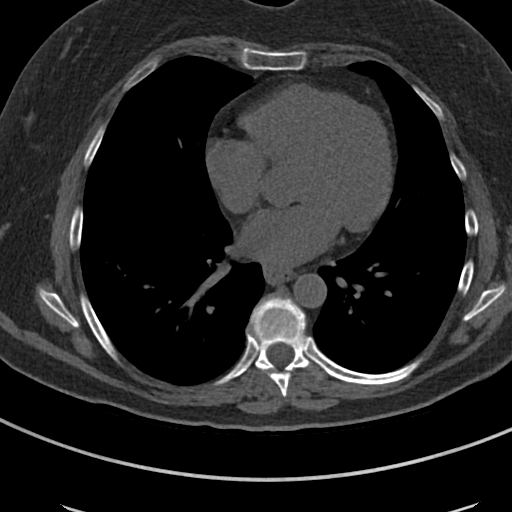
[im 23/34  vessel]
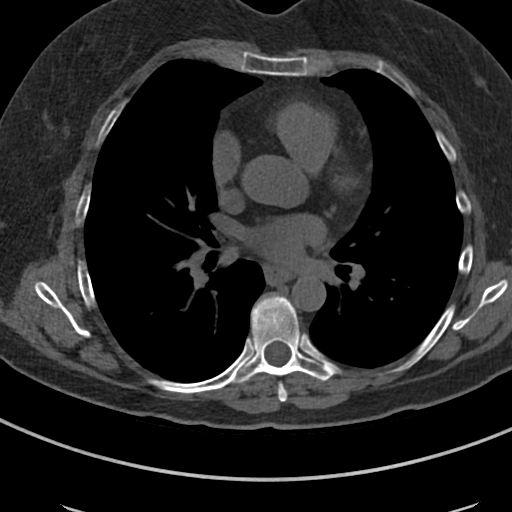
[im 28/34  vessel]
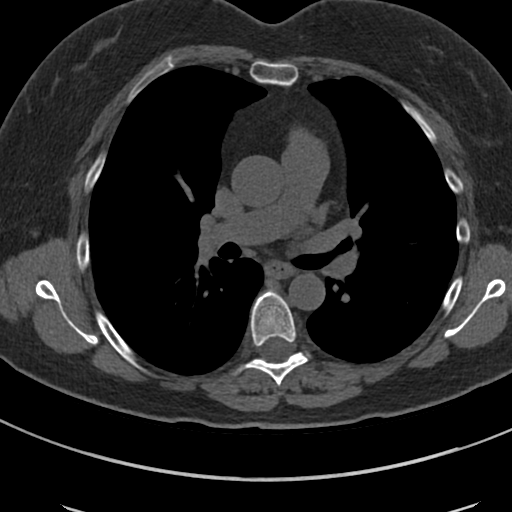
[im 28/34  lung]
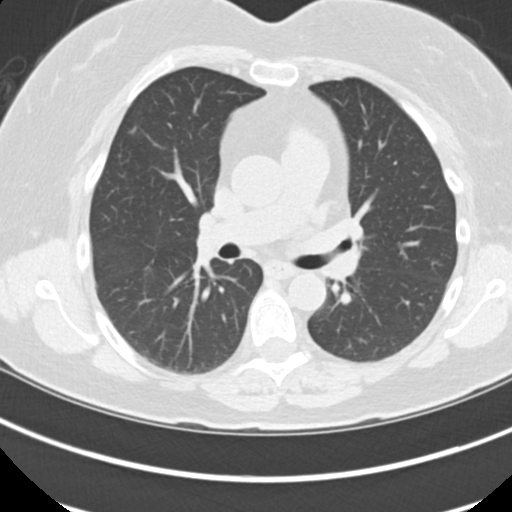

[Series 10: full fov lungs calcium scoring 3.00 ax · axial · 0.59mm/px · z∈[-1047,-981]mm · 5 of 34 slices shown]
[im 6/34  vessel]
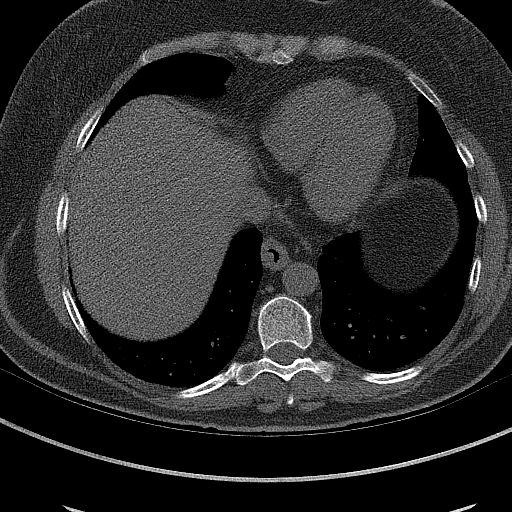
[im 12/34  vessel]
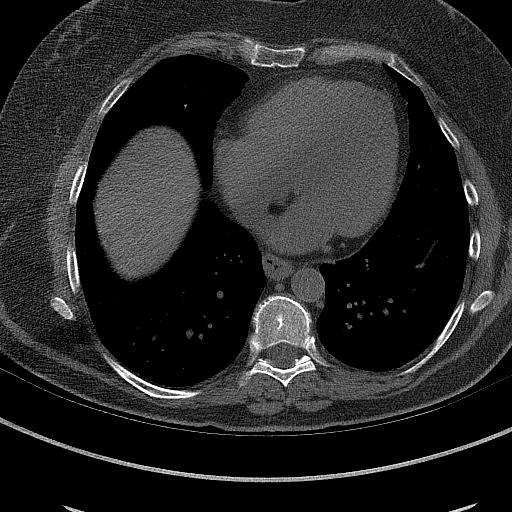
[im 17/34  vessel]
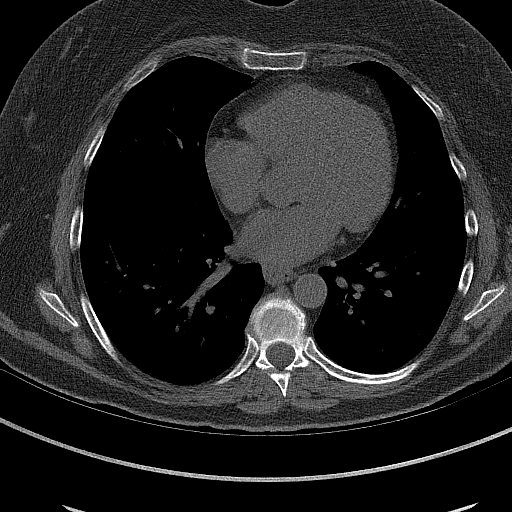
[im 23/34  vessel]
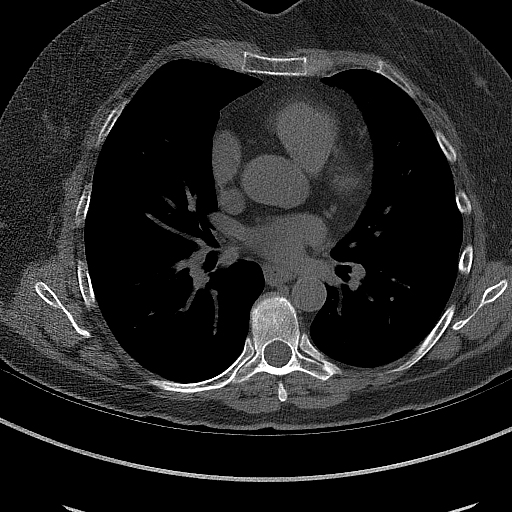
[im 28/34  vessel]
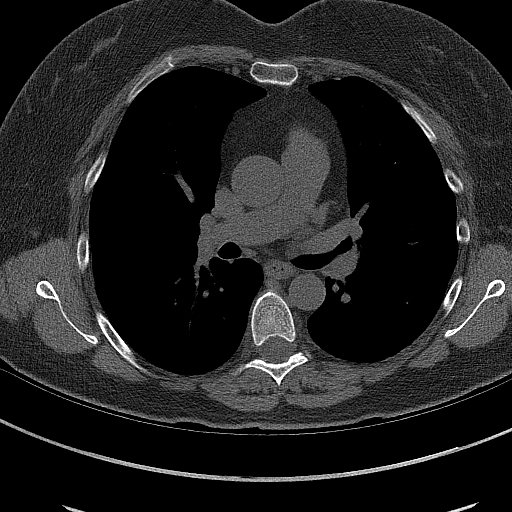

[14 of 20 positions shown; findings below may reference images not displayed]

FINDINGS: Vascular: Heart is normal size.  Aorta normal caliber.

Mediastinum/Nodes: No adenopathy

Lungs/Pleura: No confluent opacities or effusions.

Upper Abdomen: Imaging into the upper abdomen demonstrates no acute
findings.

Musculoskeletal: Chest wall soft tissues are unremarkable. No acute
bony abnormality.
IMPRESSION: No acute or significant extracardiac abnormality
FINDINGS: Non-cardiac: See separate report from [REDACTED].

Ascending Aorta: Normal size

Pericardium: Normal

Coronary arteries: Normal origin of left and right coronary
arteries. Distribution of arterial calcifications if present, as
noted below;

LM 0

LAD 0

LCx 0

RCA 0

Total 0

IMPRESSION AND RECOMMENDATION:
1. Coronary calcium score of 0. Patient is low risk for coronary
events.

2. CAC 0, JOMPA ROMANCHUK0.

3. Continue heart healthy lifestyle and risk factor modification.

Behched Simsek

*** End of Addendum ***
EXAM:
OVER-READ INTERPRETATION  CT CHEST

The following report is an over-read performed by radiologist Dr.
Felner Ceola [REDACTED] on 05/18/2021. This over-read
does not include interpretation of cardiac or coronary anatomy or
pathology. The coronary calcium score interpretation by the
cardiologist is attached.
FINDINGS: Vascular: Heart is normal size.  Aorta normal caliber.

Mediastinum/Nodes: No adenopathy

Lungs/Pleura: No confluent opacities or effusions.

Upper Abdomen: Imaging into the upper abdomen demonstrates no acute
findings.

Musculoskeletal: Chest wall soft tissues are unremarkable. No acute
bony abnormality.
IMPRESSION: No acute or significant extracardiac abnormality

## 2024-01-22 ENCOUNTER — Telehealth: Admitting: Family Medicine

## 2024-01-22 ENCOUNTER — Other Ambulatory Visit: Payer: Self-pay

## 2024-01-22 DIAGNOSIS — J069 Acute upper respiratory infection, unspecified: Secondary | ICD-10-CM | POA: Diagnosis not present

## 2024-01-22 MED ORDER — BENZONATATE 100 MG PO CAPS
200.0000 mg | ORAL_CAPSULE | Freq: Three times a day (TID) | ORAL | 0 refills | Status: DC | PRN
  Filled 2024-01-22: qty 30, 5d supply, fill #0

## 2024-01-22 MED ORDER — IPRATROPIUM BROMIDE 0.03 % NA SOLN
2.0000 | Freq: Two times a day (BID) | NASAL | 0 refills | Status: AC
  Filled 2024-01-22: qty 30, 75d supply, fill #0

## 2024-01-22 NOTE — Progress Notes (Signed)
 We are sorry you are not feeling well.  Here is how we plan to help!  Based on what you have shared with me, it looks like you may have a viral upper respiratory infection.  Upper respiratory infections are caused by a large number of viruses; however, rhinovirus is the most common cause.   Symptoms vary from person to person, with common symptoms including sore throat, cough, and fatigue or lack of energy.  A low-grade fever of up to 100.4 may present, but is often uncommon.  Symptoms vary however, and are closely related to a person's age or underlying illnesses.  The most common symptoms associated with an upper respiratory infection are nasal discharge or congestion, cough, sneezing, headache and pressure in the ears and face.  These symptoms usually persist for about 3 to 10 days, but can last up to 2 weeks.  It is important to know that upper respiratory infections do not cause serious illness or complications in most cases.    Upper respiratory infections can be transmitted from person to person, with the most common method of transmission being a person's hands.  The virus is able to live on the skin and can infect other persons for up to 2 hours after direct contact.  Also, these can be transmitted when someone coughs or sneezes; thus, it is important to cover the mouth to reduce this risk.  To keep the spread of the illness at bay, good hand hygiene is very important.  This is an infection that is most likely caused by a virus. There are no specific treatments other than to help you with the symptoms until the infection runs its course.  We are sorry you are not feeling well.  Here is how we plan to help!   For nasal congestion, you may use an oral decongestants such as Mucinex  D or if you have glaucoma or high blood pressure use plain Mucinex .  Saline nasal spray or nasal drops can help and can safely be used as often as needed for congestion.  For your congestion, I have prescribed Ipratropium  Bromide nasal spray 0.03% two sprays in each nostril 2-3 times a day  If you do not have a history of heart disease, hypertension, diabetes or thyroid  disease, prostate/bladder issues or glaucoma, you may also use Sudafed to treat nasal congestion.  It is highly recommended that you consult with a pharmacist or your primary care physician to ensure this medication is safe for you to take.     If you have a cough, you may use cough suppressants such as Delsym and Robitussin.  If you have glaucoma or high blood pressure, you can also use Coricidin HBP.   For cough I have prescribed for you A prescription cough medication called Tessalon  Perles 100 mg. You may take 1-2 capsules every 8 hours as needed for cough  If you have a sore or scratchy throat, use a saltwater gargle-  to  teaspoon of salt dissolved in a 4-ounce to 8-ounce glass of warm water.  Gargle the solution for approximately 15-30 seconds and then spit.  It is important not to swallow the solution.  You can also use throat lozenges/cough drops and Chloraseptic spray to help with throat pain or discomfort.  Warm or cold liquids can also be helpful in relieving throat pain.  For headache, pain or general discomfort, you can use Ibuprofen or Tylenol as directed.   Some authorities believe that zinc sprays or the use of Echinacea  may shorten the course of your symptoms.   HOME CARE Only take medications as instructed by your medical team. Be sure to drink plenty of fluids. Water is fine as well as fruit juices, sodas and electrolyte beverages. You may want to stay away from caffeine or alcohol. If you are nauseated, try taking small sips of liquids. How do you know if you are getting enough fluid? Your urine should be a pale yellow or almost colorless. Get rest. Taking a steamy shower or using a humidifier may help nasal congestion and ease sore throat pain. You can place a towel over your head and breathe in the steam from hot water coming  from a faucet. Using a saline nasal spray works much the same way. Cough drops, hard candies and sore throat lozenges may ease your cough. Avoid close contacts especially the very young and the elderly Cover your mouth if you cough or sneeze Always remember to wash your hands.   GET HELP RIGHT AWAY IF: You develop worsening fever. If your symptoms do not improve within 10 days You develop yellow or green discharge from your nose over 3 days. You have coughing fits You develop a severe head ache or visual changes. You develop shortness of breath, difficulty breathing or start having chest pain Your symptoms persist after you have completed your treatment plan  MAKE SURE YOU  Understand these instructions. Will watch your condition. Will get help right away if you are not doing well or get worse.  Your e-visit answers were reviewed by a board certified advanced clinical practitioner to complete your personal care plan. Depending upon the condition, your plan could have included both over the counter or prescription medications. Please review your pharmacy choice. If there is a problem, you may call our nursing hot line at and have the prescription routed to another pharmacy. Your safety is important to us . If you have drug allergies check your prescription carefully.   You can use MyChart to ask questions about today's visit, request a non-urgent call back, or ask for a work or school excuse for 24 hours related to this e-Visit. If it has been greater than 24 hours you will need to follow up with your provider, or enter a new e-Visit to address those concerns. You will get an e-mail in the next two days asking about your experience.  I hope that your e-visit has been valuable and will speed your recovery. Thank you for using e-visits.   I have spent 5 minutes in review of e-visit questionnaire, review and updating patient chart, medical decision making and response to patient.   Chiquita CHRISTELLA Barefoot, NP

## 2024-01-29 ENCOUNTER — Telehealth: Payer: Self-pay

## 2024-01-29 NOTE — Telephone Encounter (Signed)
 Copied from CRM #[id number]. Topic: General - Other >> Jan 29, 2024  1:02 PM Viola F wrote: Reason for CRM: Patient is signing up for insurance and needs the [employer] pcp code for provider Rollene Flatness, she says its not the NPI number, it's a 7 digit code - please call her at [phone] (M)

## 2024-01-29 NOTE — Telephone Encounter (Signed)
 Spoke to pt informed her that per Mrs Nathanel that she needs to go on the [facility] website to get the number pt verbalized understanding

## 2024-03-02 ENCOUNTER — Other Ambulatory Visit: Payer: Self-pay

## 2024-03-02 DIAGNOSIS — S52542A Smith's fracture of left radius, initial encounter for closed fracture: Secondary | ICD-10-CM | POA: Diagnosis not present

## 2024-03-02 MED ORDER — HYDROCODONE-ACETAMINOPHEN 5-325 MG PO TABS
1.0000 | ORAL_TABLET | Freq: Four times a day (QID) | ORAL | 0 refills | Status: DC
  Filled 2024-03-02: qty 20, 5d supply, fill #0

## 2024-03-06 ENCOUNTER — Other Ambulatory Visit: Payer: Self-pay | Admitting: Surgery

## 2024-03-06 DIAGNOSIS — S52532A Colles' fracture of left radius, initial encounter for closed fracture: Secondary | ICD-10-CM | POA: Diagnosis not present

## 2024-03-06 DIAGNOSIS — M25532 Pain in left wrist: Secondary | ICD-10-CM | POA: Diagnosis not present

## 2024-03-12 ENCOUNTER — Inpatient Hospital Stay
Admission: RE | Admit: 2024-03-12 | Discharge: 2024-03-12 | Disposition: A | Source: Ambulatory Visit | Attending: Surgery

## 2024-03-12 ENCOUNTER — Other Ambulatory Visit: Payer: Self-pay

## 2024-03-12 HISTORY — DX: Gastro-esophageal reflux disease without esophagitis: K21.9

## 2024-03-12 HISTORY — DX: Hyperlipidemia, unspecified: E78.5

## 2024-03-12 HISTORY — DX: Unspecified fracture of the lower end of left radius, initial encounter for closed fracture: S52.502A

## 2024-03-12 MED ORDER — CHLORHEXIDINE GLUCONATE 0.12 % MT SOLN
15.0000 mL | Freq: Once | OROMUCOSAL | Status: AC
  Administered 2024-03-13: 15 mL via OROMUCOSAL

## 2024-03-12 MED ORDER — LACTATED RINGERS IV SOLN: INTRAVENOUS | Status: DC

## 2024-03-12 MED ORDER — CEFAZOLIN SODIUM-DEXTROSE 2-4 GM/100ML-% IV SOLN
2.0000 g | INTRAVENOUS | Status: AC
  Administered 2024-03-13: 2 g via INTRAVENOUS

## 2024-03-12 MED ORDER — ORAL CARE MOUTH RINSE: 15.0000 mL | Freq: Once | OROMUCOSAL | Status: AC

## 2024-03-12 NOTE — Patient Instructions (Addendum)
 Your procedure is scheduled on:03-13-24 Wednesday Report to the Registration Desk on the 1st floor of the [facility].Then proceed to the 2nd floor Surgery Desk To find out your arrival time, please call [phone] between 1PM - 3PM on:03-12-24 Tuesday If your arrival time is 6:00 am, do not arrive before that time as the [facility] entrance doors do not open until 6:00 am.  REMEMBER: Instructions that are not followed completely may result in serious medical risk, up to and including death; or upon the discretion of your surgeon and anesthesiologist your surgery may need to be rescheduled.  Do not eat food after midnight the night before surgery.  No gum chewing or hard candies.  You may however, drink CLEAR liquids up to 2 hours before you are scheduled to arrive for your surgery. Do not drink anything within 2 hours of your scheduled arrival time.  Clear liquids include: - water  - apple juice without pulp - gatorade (not RED colors) - black coffee or tea (Do NOT add milk or creamers to the coffee or tea) Do NOT drink anything that is not on this list.  In addition, your doctor has ordered for you to drink the provided:  Ensure Pre-Surgery Clear Carbohydrate Drink  Drinking this carbohydrate drink up to two hours before surgery helps to reduce insulin resistance and improve patient outcomes. Please complete drinking 2 hours before scheduled arrival time.  One week prior to surgery:Stop NOW (03-12-24) Stop Anti-inflammatories (NSAIDS) such as Advil, Aleve, Ibuprofen, Motrin, Naproxen, Naprosyn and Aspirin based products such as Excedrin, Goody's Powder, BC Powder. Stop ANY OVER THE COUNTER supplements until after surgery.  You may however, continue to take Tylenol /Hydrocodone  if needed for pain up until the day of surgery.  Continue taking all of your other prescription medications up until the day of surgery.  ON THE DAY OF SURGERY ONLY TAKE THESE MEDICATIONS WITH SIPS OF  WATER: -You may take HYDROcodone -acetaminophen  (NORCO/VICODIN) if needed  No Alcohol for 24 hours before or after surgery.  No Smoking including e-cigarettes for 24 hours before surgery.  No chewable tobacco products for at least 6 hours before surgery.  No nicotine patches on the day of surgery.  Do not use any recreational drugs for at least a week (preferably 2 weeks) before your surgery.  Please be advised that the combination of cocaine and anesthesia may have negative outcomes, up to and including death. If you test positive for cocaine, your surgery will be cancelled.  On the morning of surgery brush your teeth with toothpaste and water, you may rinse your mouth with mouthwash if you wish. Do not swallow any toothpaste or mouthwash.  Use CHG Soap as directed on instruction sheet.  Do not wear jewelry, make-up, hairpins, clips or nail polish.  For welded (permanent) jewelry: bracelets, anklets, waist bands, etc.  Please have this removed prior to surgery.  If it is not removed, there is a chance that hospital personnel will need to cut it off on the day of surgery.  Do not wear lotions, powders, or perfumes.   Do not shave body hair from the neck down 48 hours before surgery.  Contact lenses, hearing aids and dentures may not be worn into surgery.  Do not bring valuables to the hospital. [facility] is not responsible for any missing/lost belongings or valuables.   Notify your doctor if there is any change in your medical condition (cold, fever, infection).  Wear comfortable clothing (specific to your surgery type)  to the hospital.  After surgery, you can help prevent lung complications by doing breathing exercises.  Take deep breaths and cough every 1-2 hours. Your doctor may order a device called an Incentive Spirometer to help you take deep breaths. When coughing or sneezing, hold a pillow firmly against your incision with both hands. This is called "splinting." Doing  this helps protect your incision. It also decreases belly discomfort.  If you are being admitted to the hospital overnight, leave your suitcase in the car. After surgery it may be brought to your room.  In case of increased patient census, it may be necessary for you, the patient, to continue your postoperative care in the Same Day Surgery department.  If you are being discharged the day of surgery, you will not be allowed to drive home. You will need a responsible individual to drive you home and stay with you for 24 hours after surgery.   If you are taking public transportation, you will need to have a responsible individual with you.  Please call the Pre-admissions Testing Dept. at [phone] if you have any questions about these instructions.  Surgery Visitation Policy:  Patients having surgery or a procedure may have two visitors.  Children under the age of 72 must have an adult with them who is not the patient.                                                                                                             Preparing for Surgery with CHLORHEXIDINE GLUCONATE (CHG) Soap  Chlorhexidine Gluconate (CHG) Soap  o An antiseptic cleaner that kills germs and bonds with the skin to continue killing germs even after washing  o Used for showering the night before surgery and morning of surgery  Before surgery, you can play an important role by reducing the number of germs on your skin.  CHG (Chlorhexidine gluconate) soap is an antiseptic cleanser which kills germs and bonds with the skin to continue killing germs even after washing.  Please do not use if you have an allergy to CHG or antibacterial soaps. If your skin becomes reddened/irritated stop using the CHG.  1. Shower the NIGHT BEFORE SURGERY with CHG soap.  2. If you choose to wash your hair, wash your hair first as usual with your normal shampoo.  3. After shampooing, rinse your hair and body thoroughly to remove  the shampoo.  4. Use CHG as you would any other liquid soap. You can apply CHG directly to the skin and wash gently with a clean washcloth.  5. Apply the CHG soap to your body only from the neck down. Do not use on open wounds or open sores. Avoid contact with your eyes, ears, mouth, and genitals (private parts). Wash face and genitals (private parts) with your normal soap.  6. Wash thoroughly, paying special attention to the area where your surgery will be performed.  7. Thoroughly rinse your body with warm water.  8. Do not shower/wash with your normal soap after using and rinsing  off the CHG soap.  9. Do not use lotions, oils, etc., after showering with CHG.  10. Pat yourself dry with a clean towel.  11. Wear clean pajamas to bed the night before surgery.  12. Place clean sheets on your bed the night of your shower and do not sleep with pets.  13. Do not apply any deodorants/lotions/powders.  14. Please wear clean clothes to the hospital.  15. Remember to brush your teeth with your regular toothpaste.   Merchandiser, Retail to address health-related social needs:  [email or web address]

## 2024-03-13 ENCOUNTER — Encounter: Payer: Self-pay | Admitting: Surgery

## 2024-03-13 ENCOUNTER — Other Ambulatory Visit: Payer: Self-pay

## 2024-03-13 ENCOUNTER — Ambulatory Visit: Admitting: Anesthesiology

## 2024-03-13 ENCOUNTER — Encounter
Admission: RE | Disposition: A | Discharge status: Home / Self Care | Payer: Self-pay | Source: Home / Self Care | Attending: Surgery

## 2024-03-13 ENCOUNTER — Ambulatory Visit

## 2024-03-13 ENCOUNTER — Ambulatory Visit
Admission: RE | Admit: 2024-03-13 | Discharge: 2024-03-13 | Disposition: A | Discharge status: Home / Self Care | Attending: Surgery | Admitting: Surgery

## 2024-03-13 DIAGNOSIS — G8918 Other acute postprocedural pain: Secondary | ICD-10-CM | POA: Diagnosis not present

## 2024-03-13 DIAGNOSIS — S52502A Unspecified fracture of the lower end of left radius, initial encounter for closed fracture: Secondary | ICD-10-CM | POA: Diagnosis not present

## 2024-03-13 DIAGNOSIS — K219 Gastro-esophageal reflux disease without esophagitis: Secondary | ICD-10-CM | POA: Diagnosis not present

## 2024-03-13 DIAGNOSIS — E785 Hyperlipidemia, unspecified: Secondary | ICD-10-CM | POA: Diagnosis not present

## 2024-03-13 DIAGNOSIS — S52572A Other intraarticular fracture of lower end of left radius, initial encounter for closed fracture: Secondary | ICD-10-CM | POA: Diagnosis not present

## 2024-03-13 DIAGNOSIS — S52532A Colles' fracture of left radius, initial encounter for closed fracture: Secondary | ICD-10-CM | POA: Diagnosis not present

## 2024-03-13 DIAGNOSIS — S52612A Displaced fracture of left ulna styloid process, initial encounter for closed fracture: Secondary | ICD-10-CM | POA: Diagnosis not present

## 2024-03-13 HISTORY — PX: OPEN REDUCTION INTERNAL FIXATION (ORIF) DISTAL RADIAL FRACTURE: SHX5989

## 2024-03-13 SURGERY — OPEN REDUCTION INTERNAL FIXATION (ORIF) DISTAL RADIUS FRACTURE
Anesthesia: General | Site: Wrist | Laterality: Left

## 2024-03-13 MED ORDER — MIDAZOLAM HCL 2 MG/2ML IJ SOLN
INTRAMUSCULAR | Status: AC
  Filled 2024-03-13: qty 2

## 2024-03-13 MED ORDER — EPHEDRINE 5 MG/ML INJ
INTRAVENOUS | Status: AC
  Filled 2024-03-13: qty 5

## 2024-03-13 MED ORDER — BUPIVACAINE LIPOSOME 1.3 % IJ SUSP
INTRAMUSCULAR | Status: AC
  Filled 2024-03-13: qty 10

## 2024-03-13 MED ORDER — MIDAZOLAM HCL (PF) 2 MG/2ML IJ SOLN
1.0000 mg | INTRAMUSCULAR | Status: DC | PRN
  Administered 2024-03-13: 1 mg via INTRAVENOUS

## 2024-03-13 MED ORDER — DEXMEDETOMIDINE HCL IN NACL 80 MCG/20ML IV SOLN
INTRAVENOUS | Status: DC | PRN
  Administered 2024-03-13 (×2): 6 ug via INTRAVENOUS

## 2024-03-13 MED ORDER — CHLORHEXIDINE GLUCONATE 0.12 % MT SOLN
OROMUCOSAL | Status: AC
  Filled 2024-03-13: qty 15

## 2024-03-13 MED ORDER — HYDROCODONE-ACETAMINOPHEN 5-325 MG PO TABS
1.0000 | ORAL_TABLET | Freq: Four times a day (QID) | ORAL | 0 refills | Status: AC
  Filled 2024-03-13: qty 30, 8d supply, fill #0

## 2024-03-13 MED ORDER — ONDANSETRON HCL 4 MG/2ML IJ SOLN
INTRAMUSCULAR | Status: AC
  Filled 2024-03-13: qty 2

## 2024-03-13 MED ORDER — DEXMEDETOMIDINE HCL IN NACL 80 MCG/20ML IV SOLN
INTRAVENOUS | Status: AC
  Filled 2024-03-13: qty 20

## 2024-03-13 MED ORDER — BUPIVACAINE LIPOSOME 1.3 % IJ SUSP
INTRAMUSCULAR | Status: DC | PRN
  Administered 2024-03-13: 10 mL via PERINEURAL

## 2024-03-13 MED ORDER — PROPOFOL 500 MG/50ML IV EMUL
INTRAVENOUS | Status: DC | PRN
  Administered 2024-03-13: 100 ug/kg/min via INTRAVENOUS

## 2024-03-13 MED ORDER — PROPOFOL 1000 MG/100ML IV EMUL
INTRAVENOUS | Status: AC
  Filled 2024-03-13: qty 100

## 2024-03-13 MED ORDER — FENTANYL CITRATE (PF) 100 MCG/2ML IJ SOLN
INTRAMUSCULAR | Status: AC
  Filled 2024-03-13: qty 2

## 2024-03-13 MED ORDER — ACETAMINOPHEN 10 MG/ML IV SOLN
INTRAVENOUS | Status: AC
  Filled 2024-03-13: qty 100

## 2024-03-13 MED ORDER — OXYCODONE HCL 5 MG PO TABS
5.0000 mg | ORAL_TABLET | Freq: Once | ORAL | Status: AC | PRN
  Administered 2024-03-13: 5 mg via ORAL

## 2024-03-13 MED ORDER — CEFAZOLIN SODIUM-DEXTROSE 2-4 GM/100ML-% IV SOLN
INTRAVENOUS | Status: AC
  Filled 2024-03-13: qty 100

## 2024-03-13 MED ORDER — EPHEDRINE SULFATE-NACL 50-0.9 MG/10ML-% IV SOSY
PREFILLED_SYRINGE | INTRAVENOUS | Status: DC | PRN
  Administered 2024-03-13: 5 mg via INTRAVENOUS
  Administered 2024-03-13: 10 mg via INTRAVENOUS

## 2024-03-13 MED ORDER — LIDOCAINE HCL (PF) 2 % IJ SOLN
INTRAMUSCULAR | Status: AC
  Filled 2024-03-13: qty 5

## 2024-03-13 MED ORDER — PROPOFOL 10 MG/ML IV BOLUS
INTRAVENOUS | Status: DC | PRN
  Administered 2024-03-13: 30 mg via INTRAVENOUS
  Administered 2024-03-13: 20 mg via INTRAVENOUS
  Administered 2024-03-13: 30 mg via INTRAVENOUS

## 2024-03-13 MED ORDER — PROPOFOL 10 MG/ML IV BOLUS
INTRAVENOUS | Status: AC
  Filled 2024-03-13: qty 20

## 2024-03-13 MED ORDER — 0.9 % SODIUM CHLORIDE (POUR BTL) OPTIME
TOPICAL | Status: DC | PRN
  Administered 2024-03-13: 500 mL

## 2024-03-13 MED ORDER — BUPIVACAINE HCL (PF) 0.5 % IJ SOLN
INTRAMUSCULAR | Status: AC
  Filled 2024-03-13: qty 30

## 2024-03-13 MED ORDER — ONDANSETRON HCL 4 MG/2ML IJ SOLN
INTRAMUSCULAR | Status: DC | PRN
  Administered 2024-03-13: 4 mg via INTRAVENOUS

## 2024-03-13 MED ORDER — FENTANYL CITRATE (PF) 50 MCG/ML IJ SOSY
PREFILLED_SYRINGE | INTRAMUSCULAR | Status: AC
  Filled 2024-03-13: qty 1

## 2024-03-13 MED ORDER — FENTANYL CITRATE (PF) 50 MCG/ML IJ SOSY
50.0000 ug | PREFILLED_SYRINGE | Freq: Once | INTRAMUSCULAR | Status: AC
  Administered 2024-03-13: 50 ug via INTRAVENOUS

## 2024-03-13 MED ORDER — KETOROLAC TROMETHAMINE 15 MG/ML IJ SOLN
INTRAMUSCULAR | Status: AC
  Filled 2024-03-13: qty 1

## 2024-03-13 MED ORDER — LIDOCAINE HCL (CARDIAC) PF 100 MG/5ML IV SOSY
PREFILLED_SYRINGE | INTRAVENOUS | Status: DC | PRN
  Administered 2024-03-13: 60 mg via INTRAVENOUS

## 2024-03-13 MED ORDER — FENTANYL CITRATE (PF) 100 MCG/2ML IJ SOLN
INTRAMUSCULAR | Status: DC | PRN
  Administered 2024-03-13 (×2): 50 ug via INTRAVENOUS

## 2024-03-13 MED ORDER — BUPIVACAINE HCL (PF) 0.5 % IJ SOLN
INTRAMUSCULAR | Status: DC | PRN
  Administered 2024-03-13: 20 mL via PERINEURAL

## 2024-03-13 MED ORDER — LIDOCAINE HCL (PF) 1 % IJ SOLN
INTRAMUSCULAR | Status: AC
  Filled 2024-03-13: qty 5

## 2024-03-13 MED ORDER — BUPIVACAINE HCL (PF) 0.5 % IJ SOLN
INTRAMUSCULAR | Status: AC
  Filled 2024-03-13: qty 20

## 2024-03-13 MED ORDER — OXYCODONE HCL 5 MG/5ML PO SOLN: 5.0000 mg | Freq: Once | ORAL | Status: AC | PRN

## 2024-03-13 MED ORDER — FENTANYL CITRATE (PF) 100 MCG/2ML IJ SOLN
25.0000 ug | INTRAMUSCULAR | Status: DC | PRN
  Administered 2024-03-13 (×3): 25 ug via INTRAVENOUS

## 2024-03-13 MED ORDER — KETOROLAC TROMETHAMINE 15 MG/ML IJ SOLN
15.0000 mg | Freq: Once | INTRAMUSCULAR | Status: AC
  Administered 2024-03-13: 15 mg via INTRAVENOUS

## 2024-03-13 MED ORDER — OXYCODONE HCL 5 MG PO TABS
ORAL_TABLET | ORAL | Status: AC
  Filled 2024-03-13: qty 1

## 2024-03-13 SURGICAL SUPPLY — 50 items
APPLICATOR CHLORAPREP 10 TEAL (MISCELLANEOUS) IMPLANT
BENZOIN TINCTURE PRP APPL 2/3 (GAUZE/BANDAGES/DRESSINGS) IMPLANT
BIT DRILL 2.2 SS TIBIAL (BIT) IMPLANT
BNDG COHESIVE 4X5 TAN STRL LF (GAUZE/BANDAGES/DRESSINGS) ×1 IMPLANT
BNDG ELASTIC 4INX 5YD STR LF (GAUZE/BANDAGES/DRESSINGS) ×1 IMPLANT
BNDG ESMARCH 4X12 STRL LF (GAUZE/BANDAGES/DRESSINGS) ×1 IMPLANT
CHLORAPREP W/TINT 26 (MISCELLANEOUS) ×2 IMPLANT
CORD BIP STRL DISP 12FT (MISCELLANEOUS) ×1 IMPLANT
CUFF TOURN SGL QUICK 18X4 (TOURNIQUET CUFF) IMPLANT
DRAPE FLUOR MINI C-ARM 54X84 (DRAPES) ×1 IMPLANT
DRAPE SURG 17X11 SM STRL (DRAPES) ×1 IMPLANT
DRAPE SURG ORHT 6 SPLT 77X108 (DRAPES) ×1 IMPLANT
DRAPE U-SHAPE 47X51 STRL (DRAPES) ×1 IMPLANT
ELECT CAUTERY BLADE 6.4 (BLADE) ×1 IMPLANT
ELECTRODE REM PT RTRN 9FT ADLT (ELECTROSURGICAL) ×1 IMPLANT
FORCEPS JEWEL BIP 4-3/4 STR (INSTRUMENTS) ×1 IMPLANT
GAUZE SPONGE 4X4 12PLY STRL (GAUZE/BANDAGES/DRESSINGS) ×1 IMPLANT
GAUZE XEROFORM 1X8 LF (GAUZE/BANDAGES/DRESSINGS) ×1 IMPLANT
GLOVE BIO SURGEON STRL SZ8 (GLOVE) ×1 IMPLANT
GLOVE BIOGEL PI IND STRL 8 (GLOVE) ×1 IMPLANT
GOWN STRL REUS W/ TWL LRG LVL3 (GOWN DISPOSABLE) ×1 IMPLANT
GOWN STRL REUS W/ TWL XL LVL3 (GOWN DISPOSABLE) ×1 IMPLANT
KIT TURNOVER KIT A (KITS) ×1 IMPLANT
KWIRE FX5X1.6XNS BN SS (WIRE) IMPLANT
MANIFOLD NEPTUNE II (INSTRUMENTS) ×1 IMPLANT
NDL FILTER BLUNT 18X1 1/2 (NEEDLE) ×1 IMPLANT
NS IRRIG 500ML POUR BTL (IV SOLUTION) ×1 IMPLANT
PACK EXTREMITY ARMC (MISCELLANEOUS) ×1 IMPLANT
PADDING CAST BLEND 4X4 STRL (MISCELLANEOUS) ×2 IMPLANT
PEG LOCKING SMOOTH 2.2X16 (Screw) IMPLANT
PEG LOCKING SMOOTH 2.2X18 (Peg) IMPLANT
PEG LOCKING SMOOTH 2.2X20 (Screw) IMPLANT
PENCIL SMOKE EVACUATOR (MISCELLANEOUS) ×1 IMPLANT
PLATE BN MN NAR 41X22 CRSLCK (Plate) IMPLANT
SCREW LOCK 20X2.7X 3 LD TPR (Screw) IMPLANT
SCREW LOCKING 2.7X15MM (Screw) IMPLANT
SCREW LP NL 2.7X16MM (Screw) IMPLANT
SCREW NLOCK 24X2.7 3 LD (Screw) IMPLANT
SCREW NONLOCK 2.7X18MM (Screw) IMPLANT
SOLN STERILE WATER 500 ML (IV SOLUTION) ×1 IMPLANT
SPLINT CAST 1 STEP 3X12 (MISCELLANEOUS) IMPLANT
SPLINT CAST 1 STEP 4X15 (MISCELLANEOUS) IMPLANT
STAPLER SKIN PROX 35W (STAPLE) ×1 IMPLANT
STOCKINETTE IMPERVIOUS 9X36 MD (GAUZE/BANDAGES/DRESSINGS) ×1 IMPLANT
STRIP CLOSURE SKIN 1/4X4 (GAUZE/BANDAGES/DRESSINGS) IMPLANT
SUT PROLENE 4 0 PS 2 18 (SUTURE) ×1 IMPLANT
SUT VIC AB 2-0 SH 27XBRD (SUTURE) ×1 IMPLANT
SUT VIC AB 3-0 SH 27X BRD (SUTURE) ×1 IMPLANT
SYR 10ML LL (SYRINGE) ×1 IMPLANT
TRAP FLUID SMOKE EVACUATOR (MISCELLANEOUS) ×1 IMPLANT

## 2024-03-13 NOTE — H&P (Signed)
 History of Present Illness:  Tammy Chang is a 63 y.o. female who presents for evaluation and treatment of her left wrist injury. Apparently, the patient fell off of a ladder onto her outstretched left hand 4 days ago while trying to retrieve her grandson's ball off the roof. She presented to [facility]'s urgent care where she underwent a closed reduction of a displaced intra-articular distal radius and ulna fracture, then was advised to follow-up with orthopedics. The patient notes moderate pain in her wrist which she rates at 3/10 on today's visit, but is taking only over-the-counter medications as needed for discomfort. She denies any prior issues with her wrist, and denies any numbness or paresthesias to her hand.  Allergies: No Known Allergies  Past Medical History: No past medical history on file.  Past Surgical History: No pertinent surgical history.   Past Family History: No family history on file.   Social History:   Socioeconomic History:  Marital status: Unknown  Tobacco Use  Smoking status: Never  Smokeless tobacco: Never  Substance and Sexual Activity  Drug use: Never   Review of Systems:  A comprehensive 14 point ROS was performed, reviewed, and the pertinent orthopaedic findings are documented in the HPI.  Physical Exam: Vitals:  03/06/24 1036  PainSc: 3  PainLoc: Wrist   General/Constitutional: The patient appears to be well-nourished, well-developed, and in no acute distress. Neuro/Psych: Normal mood and affect, oriented to person, place and time. Eyes: Non-icteric. Pupils are equal, round, and reactive to light, and exhibit synchronous movement. ENT: Unremarkable. Lymphatic: No palpable adenopathy. Respiratory: Lungs clear to auscultation, Normal chest excursion, No wheezes, and Non-labored breathing Cardiovascular: Regular rate and rhythm. No murmurs. and No edema, swelling or tenderness, except as noted in detailed exam. Integumentary: No impressive skin  lesions present, except as noted in detailed exam. Musculoskeletal: Unremarkable, except as noted in detailed exam.  Left wrist/hand exam: The patient is in a sugar-tong splint maintaining the wrist in neutral position. The skin is intact at the proximal and distal margins of the splint. She is able to actively flex and extend her digits without any pain or catching. She is neurovascularly intact to all digits.  Imaging:  AP, lateral, and oblique x-rays of the left wrist are obtained. These films demonstrate findings consistent with an impacted and mildly displaced intra-articular fracture of her left distal radius with an ulnar styloid fracture. No significant degenerative changes of the radiocarpal joint are identified.  Assessment: 1. Closed Colles' fracture of left radius.   Plan: The treatment options were discussed with the patient and her husband. In addition, patient educational materials were provided regarding the diagnosis and treatment options. Given the patient's age, activity level, and severity of injury, I feel that the patient would best managed with surgical fixation of her distal radius fracture. Therefore, I have recommended that we proceed with surgical intervention to include a formal open reduction and internal fixation of the displaced intra-articular left distal radius fracture. The procedure was discussed with the patient, as were the potential risks (including bleeding, infection, nerve and/or blood vessel injury, persistent or recurrent pain, loosening and/or failure of the hardware, malunion or non-union, need for further surgery, blood clots, strokes, heart attacks and/or arhythmias, pneumonia, etc.) and benefits. The patient states her understanding and wishes to proceed.    H&P reviewed and patient re-examined. No changes.

## 2024-03-13 NOTE — Transfer of Care (Signed)
 Immediate Anesthesia Transfer of Care Note  Patient: Tammy Chang  Procedure(s) Performed: OPEN REDUCTION INTERNAL FIXATION (ORIF) DISTAL RADIUS FRACTURE (Left: Wrist)  Patient Location: PACU  Anesthesia Type:Regional  Level of Consciousness: awake and alert   Airway & Oxygen Therapy: Patient Spontanous Breathing  Post-op Assessment: Report given to RN and Post -op Vital signs reviewed and stable  Post vital signs: Reviewed and stable  Last Vitals:  Vitals Value Taken Time  BP 105/84 03/13/24 10:36  Temp    Pulse 85 03/13/24 10:40  Resp 15 03/13/24 10:40  SpO2 95 % 03/13/24 10:40  Vitals shown include unfiled device data.  Last Pain:  Vitals:   03/13/24 0744  TempSrc:   PainSc: 0-No pain         Complications: No notable events documented.

## 2024-03-13 NOTE — Discharge Instructions (Addendum)
 Information for Discharge Teaching: EXPAREL (bupivacaine liposome injectable suspension)   Pain relief is important to your recovery. The goal is to control your pain so you can move easier and return to your normal activities as soon as possible after your procedure. Your physician may use several types of medicines to manage pain, swelling, and more.  Your surgeon or anesthesiologist gave you EXPAREL(bupivacaine) to help control your pain after surgery.  EXPAREL is a local anesthetic designed to release slowly over an extended period of time to provide pain relief by numbing the tissue around the surgical site. EXPAREL is designed to release pain medication over time and can control pain for up to 72 hours. Depending on how you respond to EXPAREL, you may require less pain medication during your recovery. EXPAREL can help reduce or eliminate the need for opioids during the first few days after surgery when pain relief is needed the most. EXPAREL is not an opioid and is not addictive. It does not cause sleepiness or sedation.   Important! A teal colored band has been placed on your arm with the date, time and amount of EXPAREL you have received. Please leave this armband in place for the full 96 hours following administration, and then you may remove the band. If you return to the hospital for any reason within 96 hours following the administration of EXPAREL, the armband provides important information that your health care providers to know, and alerts them that you have received this anesthetic.    Possible side effects of EXPAREL: Temporary loss of sensation or ability to move in the area where medication was injected. Nausea, vomiting, constipation Rarely, numbness and tingling in your mouth or lips, lightheadedness, or anxiety may occur. Call your doctor right away if you think you may be experiencing any of these sensations, or if you have other questions regarding possible side  effects.  Follow all other discharge instructions given to you by your surgeon or nurse. Eat a healthy diet and drink plenty of water or other fluids.   Orthopedic discharge instructions: Keep splint dry and intact. Keep hand elevated above heart level. Apply ice to affected area frequently. Take ibuprofen 600-800 mg TID with meals for 3-5 days, then as necessary. Take pain medication as prescribed or ES Tylenol  when needed.  Return for follow-up in 10-14 days or as scheduled.  Toradol given at 1115 am (similar medication to ibuprofen) Repeat in 6 to 8 hours

## 2024-03-13 NOTE — Anesthesia Procedure Notes (Signed)
 Anesthesia Regional Block: Supraclavicular block   Pre-Anesthetic Checklist: , timeout performed,  Correct Patient, Correct Site, Correct Laterality,  Correct Procedure, Correct Position, site marked,  Risks and benefits discussed,  Surgical consent,  Pre-op evaluation,  At surgeon's request and post-op pain management  Laterality: Upper and Left  Prep: chloraprep       Needles:  Injection technique: Single-shot  Needle Type: Stimiplex     Needle Length: 9cm  Needle Gauge: 22     Additional Needles:   Procedures:,,,, ultrasound used (permanent image in chart),,    Narrative:  Start time: 03/13/2024 7:46 AM End time: 03/13/2024 7:48 AM Injection made incrementally with aspirations every 5 mL.  Performed by: Personally   Additional Notes: Patient consented for risk and benefits of nerve block including but not limited to nerve damage, Horner's syndrome, failed block, bleeding and infection.  Patient voiced understanding.  Functioning IV was confirmed and monitors were applied.  Timeout done prior to procedure and prior to any sedation being given to the patient.  Patient confirmed procedure site prior to any sedation given to the patient.  A 50mm 22ga Stimuplex needle was used. Sterile prep,hand hygiene and sterile gloves were used.  Minimal sedation used for procedure.  No paresthesia endorsed by patient during the procedure.  Negative aspiration and negative test dose prior to incremental administration of local anesthetic. The patient tolerated the procedure well with no immediate complications.

## 2024-03-13 NOTE — Anesthesia Preprocedure Evaluation (Signed)
 Anesthesia Evaluation  Patient identified by MRN, date of birth, ID band Patient awake    Reviewed: Allergy & Precautions, NPO status , Patient's Chart, lab work & pertinent test results  History of Anesthesia Complications Negative for: history of anesthetic complications  Airway Mallampati: III  TM Distance: <3 FB Neck ROM: full    Dental  (+) Chipped   Pulmonary neg pulmonary ROS, neg shortness of breath   Pulmonary exam normal        Cardiovascular Exercise Tolerance: Good (-) angina (-) Past MI negative cardio ROS Normal cardiovascular exam     Neuro/Psych negative neurological ROS  negative psych ROS   GI/Hepatic Neg liver ROS,GERD  Controlled,,  Endo/Other  negative endocrine ROS    Renal/GU negative Renal ROS  negative genitourinary   Musculoskeletal   Abdominal   Peds  Hematology negative hematology ROS (+)   Anesthesia Other Findings Past Medical History: No date: Distal radius fracture, left No date: GERD (gastroesophageal reflux disease) No date: Hyperlipidemia 2011: Lump or mass in breast     Comment:  LEFT No date: Osteopenia     Comment:  DEXA 02/2015  No date: Vitamin D  deficiency  Past Surgical History: 2000: ABDOMINAL HYSTERECTOMY     Comment:  no cervix 03/2021 on exam; done for endometriosis, no               cancer, believes one has ovary. Continue pap smear until               67 yrs No date: BREAST BIOPSY; Left     Comment:  negative 02/25/2010 2011: BREAST EXCISIONAL BIOPSY; Left     Comment:  negative 03/29/2010 8008,8003: CESAREAN SECTION 07/03/2017: COLONOSCOPY WITH PROPOFOL ; N/A     Comment:  Procedure: COLONOSCOPY WITH PROPOFOL ;  Surgeon: Therisa Bi, MD;  Location: [facility] ENDOSCOPY;  Service:               Gastroenterology;  Laterality: N/A; No date: OOPHORECTOMY     Comment:  one ovary removed  2009: TONSILLECTOMY  BMI    Body Mass Index: 26.09 kg/m       Reproductive/Obstetrics negative OB ROS                              Anesthesia Physical Anesthesia Plan  ASA: 2  Anesthesia Plan: General   Post-op Pain Management: Regional block*   Induction: Intravenous  PONV Risk Score and Plan: Propofol  infusion and TIVA  Airway Management Planned: Natural Airway and Nasal Cannula  Additional Equipment:   Intra-op Plan:   Post-operative Plan:   Informed Consent: I have reviewed the patients History and Physical, chart, labs and discussed the procedure including the risks, benefits and alternatives for the proposed anesthesia with the patient or authorized representative who has indicated his/her understanding and acceptance.     Dental Advisory Given  Plan Discussed with: Anesthesiologist, CRNA and Surgeon  Anesthesia Plan Comments: (Patient consented for risks of anesthesia including but not limited to:  - adverse reactions to medications - risk of airway placement if required - damage to eyes, teeth, lips or other oral mucosa - nerve damage due to positioning  - sore throat or hoarseness - Damage to heart, brain, nerves, lungs, other parts of body or loss of life  Patient voiced understanding and assent.)  Anesthesia Quick Evaluation

## 2024-03-13 NOTE — Op Note (Signed)
 03/13/2024  10:59 AM  Patient:   Tammy Chang  Pre-Op Diagnosis:   Closed acute intra-articular distal radius fracture with ulnar styloid fracture, left wrist.  Post-Op Diagnosis:   Same.  Procedure:   Open reduction and internal fixation of displaced intra-articular left distal radius fracture.  Surgeon:   DOROTHA Reyes Maltos, MD  Assistant:   None  Anesthesia:   IV sedation with supraclavicular block placed preoperatively by anesthesiologist  Findings:   As above.  Complications:   None  EBL:   3 cc  Fluids:   700 cc crystalloid  TT:   90 minutes at 250 mmHg  Drains:   None  Closure:   3-0 Vicryl subcuticular sutures  Implants:   Biomet DVR Cross-locked narrow precontoured distal radius mini-locking plate.  Brief Clinical Note:   The patient is a 63 year old female who sustained the above-noted injury 10 days ago when she fell off a ladder while trying to retrieve a ball from her roof. She initially presented to an urgent care clinic where a preliminary reduction was performed. She followed up in our office where subsequent films demonstrated less than satisfactory alignment of her fracture. She presents at this time for definitive management of her injury.  Procedure:   The patient underwent placement of a supraclavicular block by the anesthesiologist before she was brought into the operating room and lain in the supine position. After adequate IV sedation was obtained, the patient's left hand and upper extremity were prepped with ChloraPrep solution before being draped sterilely. Preoperative antibiotics were administered. A timeout was performed to verify the appropriate surgical site before the limb was exsanguinated with an Esmarch and the tourniquet inflated to 250 mmHg.   An approximately 7-8 cm incision was made over the volar aspect of the distal radius beginning at the volar flexion crease and extending proximally along the flexor carpi radialis tendon. The incision  was carried down through the subcutaneous tissues to expose the superficial retinaculum. This was split the length of the incision directly over the flexor carpi radialis tendon. The FCR sheath was opened and the tendon retracted ulnarly to protect the median nerve. The floor of the FCR sheath was opened to expose the pronator quadratus muscle. This was released along the radial insertion and the muscle was retracted ulnarly to expose the distal radius.   The fracture was identified and soft tissues elevated off the distal metaphyseal region for several centimeters. The appropriate sized plate was selected and positioned on the distal radius. A guidewire was placed through the distal hole and its position verified using FluoroScan imaging in AP and lateral projections. After several attempts, the pin was positioned parallel to the distal articular surface and approximately 3-4 mm proximal to the articular surface. Distally, a nonlocking cortical screw was placed in the ulnar most hole of the more proximal row. The other holes were filled with locking pegs of the appropriate lengths. The plate was carefully lowered onto the volar metaphyseal surface, reducing the fracture in the process. Again the position of the plate was verified using FluoroScan imaging in AP and lateral projections and found to be excellent.   The plate was secured using a nonlocking bicortical screw proximally. One additional bicortical nonlocking screw was placed proximally followed by two locking cortical screws to secure the plate to the metaphyseal region proximally. Again the construct was assessed using FluoroScan imaging in AP, lateral, and oblique projections and found to be excellent.  The wound was copiously irrigated with  sterile saline solution before the pronator quadratus was reapproximated using 2-0 Vicryl interrupted sutures. The subcutaneous tissues were closed using 2-0 Vicryl interrupted sutures before the subcuticular  layer was closed using 3-0 Vicryl inverted interrupted sutures. Benzoin and Steri-Strips are applied to the skin. A sterile bulky dressing was applied to the wound. The patient was placed into a volar splint maintaining the wrist in neutral position before the patient was awakened and returned to the recovery room in satisfactory condition after tolerating the procedure well.

## 2024-03-14 ENCOUNTER — Encounter: Payer: Self-pay | Admitting: Surgery

## 2024-03-15 NOTE — Anesthesia Postprocedure Evaluation (Signed)
 Anesthesia Post Note  Patient: Tammy Chang  Procedure(s) Performed: OPEN REDUCTION INTERNAL FIXATION (ORIF) DISTAL RADIUS FRACTURE (Left: Wrist)  Patient location during evaluation: PACU Anesthesia Type: General Level of consciousness: awake and alert Pain management: pain level controlled Vital Signs Assessment: post-procedure vital signs reviewed and stable Respiratory status: spontaneous breathing, nonlabored ventilation and respiratory function stable Cardiovascular status: blood pressure returned to baseline and stable Postop Assessment: no apparent nausea or vomiting Anesthetic complications: no   No notable events documented.   Last Vitals:  Vitals:   03/13/24 1126 03/13/24 1140  BP: 118/74 121/82  Pulse: 85 64  Resp: 14 16  Temp: (!) 36.3 C 36.6 C  SpO2: 95% 97%    Last Pain:  Vitals:   03/13/24 1140  TempSrc: Temporal  PainSc: 0-No pain                 Fairy POUR Karlei Waldo

## 2024-03-25 ENCOUNTER — Other Ambulatory Visit: Payer: Self-pay

## 2024-03-25 DIAGNOSIS — S52532D Colles' fracture of left radius, subsequent encounter for closed fracture with routine healing: Secondary | ICD-10-CM | POA: Diagnosis not present

## 2024-03-25 DIAGNOSIS — Z9889 Other specified postprocedural states: Secondary | ICD-10-CM | POA: Diagnosis not present

## 2024-03-25 MED ORDER — HYDROCODONE-ACETAMINOPHEN 5-325 MG PO TABS
1.0000 | ORAL_TABLET | Freq: Four times a day (QID) | ORAL | 0 refills | Status: AC | PRN
  Filled 2024-03-25: qty 12, 3d supply, fill #0

## 2024-04-24 NOTE — Progress Notes (Signed)
 HPI:  Tammy Chang is a 64 y.o. female who presents for follow-up now 6 weeks status post an open reduction and internal fixation of her left distal radius fracture.  Overall, the patient feels that she is doing well.  She still notes some stiffness in the wrist, but denies any true pain.  She has been wearing her Velcro wrist immobilizer on a regular basis, removing it for bathing purposes and for some gentle home exercises that she has began to do on her own.  She has resumed many of her normal daily activities, but has been avoiding activities requiring too much of her left wrist or hand.  She is sleeping well at night.  She denies any reinjury to the wrist, and denies any fevers or chills.  She also denies any numbness or paresthesias to her fingers.  She has resumed working full-time without restrictions, and is tolerating this well.  Current Outpatient Medications  Medication Sig Dispense Refill   HYDROcodone -acetaminophen  (NORCO) 5-325 mg tablet Take 1 tablet by mouth every 6 (six) hours as needed for Pain for up to 12 doses 12 tablet 0   No current facility-administered medications for this visit.   No Known Allergies No past medical history on file. No past surgical history on file.  No family history on file.  Social History   Socioeconomic History   Marital status: Unknown  Tobacco Use   Smoking status: Never   Smokeless tobacco: Never  Substance and Sexual Activity   Drug use: Never   Social Drivers of Corporate Investment Banker Strain: Low Risk  (03/25/2024)   Overall Financial Resource Strain (CARDIA)    Difficulty of Paying Living Expenses: Not hard at all  Food Insecurity: No Food Insecurity (03/25/2024)   Hunger Vital Sign    Worried About Running Out of Food in the Last Year: Never true    Ran Out of Food in the Last Year: Never true  Transportation Needs: No Transportation Needs (03/25/2024)   PRAPARE - Administrator, Civil Service  (Medical): No    Lack of Transportation (Non-Medical): No    Review of Systems:  A comprehensive 14 point ROS was performed, reviewed, and the pertinent orthopaedic findings are documented in the HPI.  Physical Exam: Vitals:   04/24/24 1031  Weight: 69.9 kg (154 lb)  Height: 162.6 cm (5' 4)  PainSc: 0-No pain  PainLoc: Wrist   General/Constitutional: The patient appears to be well-nourished, well-developed, and in no acute distress. Neuro/Psych: Normal mood and affect, oriented to person, place and time.  Left wrist exam: Skin inspection of the is notable for well-healed surgical incision which is without evidence for infection.  There is perhaps mild residual swelling, but no erythema, ecchymosis, abrasions, or other skin abnormalities are identified.  She has most mild tenderness to palpation over the dorsal and palmar aspects of the wrist, but denies any tenderness over the dorsal or palmar aspects of the hand.  Her wrist is expectedly stiff due to her period of immobilization.  She is able to tolerate wrist flexion to 25 degrees, extension to 15 degrees, radial deviation to 10 degrees and ulnar deviation to 15 degrees.  She is able to tolerate pronation to 60 degrees and supination to -20 degrees.  She notes mild discomfort at the extremes of all motions.  She is grossly neurovascularly intact to her left hand.  X-rays/MRI/Lab data:  AP, lateral, and oblique views of the left wrist are obtained.  These  films demonstrate excellent interval healing of the distal radius fracture with overall excellent fracture alignment.  The hardware appears to be in excellent position and without evidence of loosening.  No new acute bony abnormalities are identified.  Assessment: Encounter Diagnoses  Name Primary?   Status post wrist surgery    Closed Colles' fracture of left radius, sequela Yes    Plan: The treatment options were discussed with the patient.  In addition, patient educational  materials were provided regarding the diagnosis and treatment options.  Overall, the patient is pleased with her symptomatic and functional improvement at this time.  I have recommended that she begin formal occupational therapy in order to optimize her range of motion, strength, and overall function.  She may wean out of her Velcro splint over the next week or so, and then gradually progressing her activities as symptoms permit.  She is to avoid offending activities.  She may take over-the-counter medications as needed for discomfort.  She may continue to work without restrictions.  All of the patient's questions and concerns were answered.  She can call any time with further concerns.  She will follow up in 6 weeks for re-evaluation.  Repeat x-rays of her left wrist will be obtained on return.

## 2024-04-29 ENCOUNTER — Ambulatory Visit: Attending: Surgery | Admitting: Occupational Therapy

## 2024-04-29 ENCOUNTER — Ambulatory Visit

## 2024-04-29 DIAGNOSIS — M25632 Stiffness of left wrist, not elsewhere classified: Secondary | ICD-10-CM | POA: Insufficient documentation

## 2024-04-29 DIAGNOSIS — M6281 Muscle weakness (generalized): Secondary | ICD-10-CM | POA: Diagnosis present

## 2024-04-29 NOTE — Therapy (Signed)
 " OUTPATIENT OCCUPATIONAL THERAPY ORTHO EVALUATION  Patient Name: Tammy Chang MRN: [id number] DOB:12/14/1960, 64 y.o., female Today's Date: 04/29/2024  PCP: Rollene Northern, FNP REFERRING PROVIDER: Norleen Maltos, MD  END OF SESSION:  OT End of Session - 04/29/24 1523     Visit Number 1    Number of Visits 10    Date for Recertification  06/24/24    OT Start Time 1435    OT Stop Time 1520    OT Time Calculation (min) 45 min    Behavior During Therapy [facility] for tasks assessed/performed          Past Medical History:  Diagnosis Date   Distal radius fracture, left    GERD (gastroesophageal reflux disease)    Hyperlipidemia    Lump or mass in breast 2011   LEFT   Osteopenia    DEXA 02/2015    Vitamin D  deficiency    Past Surgical History:  Procedure Laterality Date   ABDOMINAL HYSTERECTOMY  2000   no cervix 03/2021 on exam; done for endometriosis, no cancer, believes one has ovary. Continue pap smear until 67 yrs   BREAST BIOPSY Left    negative 02/25/2010   BREAST EXCISIONAL BIOPSY Left 2011   negative 03/29/2010   CESAREAN SECTION  8008,8003   COLONOSCOPY WITH PROPOFOL  N/A 07/03/2017   Procedure: COLONOSCOPY WITH PROPOFOL ;  Surgeon: Therisa Bi, MD;  Location: [facility] ENDOSCOPY;  Service: Gastroenterology;  Laterality: N/A;   OOPHORECTOMY     one ovary removed    OPEN REDUCTION INTERNAL FIXATION (ORIF) DISTAL RADIAL FRACTURE Left 03/13/2024   Procedure: OPEN REDUCTION INTERNAL FIXATION (ORIF) DISTAL RADIUS FRACTURE;  Surgeon: Maltos Norleen PARAS, MD;  Location: [facility] ORS;  Service: Orthopedics;  Laterality: Left;  MAKE 1ST CASE   TONSILLECTOMY  2009   Patient Active Problem List   Diagnosis Date Noted   Hyperlipidemia 03/29/2021   Other fatigue 03/16/2018   Elevated BUN 03/13/2017   Elevated total protein 03/13/2017   Vitamin D  deficiency 03/13/2017   Sinus congestion 02/29/2016   Routine physical examination 12/28/2015   Lump or mass in breast     ONSET DATE:  03/13/24  REFERRING DIAG: L radial fx s/p ORIF   THERAPY DIAG:  Stiffness of left wrist, not elsewhere classified  Muscle weakness (generalized)  Rationale for Evaluation and Treatment: Rehabilitation  SUBJECTIVE:   SUBJECTIVE STATEMENT: I took a week off then went back to work.  Pt accompanied by: self  PERTINENT HISTORY:   04/24/24 Ortho Office Visit: Tammy Chang is a 64 y.o. female who presents for follow-up now 6 weeks status post an open reduction and internal fixation of her left distal radius fracture.   I have recommended that she begin formal occupational therapy in order to optimize her range of motion, strength, and overall function.  She may wean out of her Velcro splint over the next week or so, and then gradually progressing her activities as symptoms permit.  She is to avoid offending activities.  She may take over-the-counter medications as needed for discomfort.  She may continue to work without restrictions.  All of the patient's questions and concerns were answered.  She can call any time with further concerns.  She will follow up in 6 weeks for re-evaluation.   PRECAUTIONS: None  RED FLAGS: None   WEIGHT BEARING RESTRICTIONS: No  PAIN:  Are you having pain? No  FALLS: Has patient fallen in last 6 months? No  LIVING ENVIRONMENT: Lives with: lives with  their family  PLOF: Independent  PATIENT GOALS: return to PLOF  NEXT MD VISIT: -  OBJECTIVE:  Note: Objective measures were completed at Evaluation unless otherwise noted.  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  FUNCTIONAL OUTCOME MEASURES: PRWH 10/100  UPPER EXTREMITY ROM:     Active ROM Right eval Left eval  Shoulder flexion    Shoulder abduction    Shoulder adduction    Shoulder extension    Shoulder internal rotation    Shoulder external rotation    Elbow flexion    Elbow extension    Wrist flexion 90 50  Wrist  extension 60 30  Wrist ulnar deviation 30 25  Wrist radial deviation 40 30  Wrist pronation  60  Wrist supination  75  (Blank rows = not tested)  Active ROM Right eval Left eval  Thumb MCP (0-60)    Thumb IP (0-80)    Thumb Radial abd/add (0-55)     Thumb Palmar abd/add (0-45)     Thumb Opposition to Small Finger     Index MCP (0-90)     Index PIP (0-100)     Index DIP (0-70)      Long MCP (0-90)      Long PIP (0-100)      Long DIP (0-70)      Ring MCP (0-90)      Ring PIP (0-100)      Ring DIP (0-70)      Little MCP (0-90)      Little PIP (0-100)      Little DIP (0-70)      (Blank rows = not tested)   UPPER EXTREMITY MMT:   WFL  HAND FUNCTION: Grip strength: Right: 80 lbs; Left: 38 lbs, Lateral pinch: Right: 16 lbs, Left: 10 lbs, and 3 point pinch: Right: 16 lbs, Left: 9 lbs pain with 3 pt  COORDINATION: WFL  SENSATION: Not tested  EDEMA: none  COGNITION: Overall cognitive status: Within functional limits for tasks assessed  OBSERVATIONS: arrives from work   TREATMENT DATE: 04/29/24                                                                                                                            Pt educated on scar massage and pain modalities including heat or contrast bath. Educated on condition management and expected progression.  Issued Cica care scar pad.   Issued light blue putty with instructions to squeeze lightly. Handout provided and HEP reviewed.   Exercises: 1 set x 10 each  - Wrist AROM Flexion Extension   - Seated Wrist Prayer Stretch    - Seated Forearm Pronation and Supination AROM    - Tip Pinch with Putty   - Key Pinch with Putty  - 3-Point Pinch with Putty    - Finger Pinch and Pull with Putty    - Seated Wrist Supination  PROM, use of heat for prolonged stretch    PATIENT EDUCATION: Education details: findings of eval and HEP  Person educated: Patient Education method: Explanation, Demonstration, Tactile cues, Verbal  cues, and Handouts Education comprehension: verbalized understanding, returned demonstration, verbal cues required, and needs further education  HOME EXERCISE PROGRAM: Access Code: [email or web address] URL: [email or web address] Date: 04/29/2024 Prepared by: Elston Slot    SHORT TERM GOALS: Target date: 6 weeks   1.  Pt will demo or state understanding of initial HEP to improve pain levels and AROM. Goal status: INITIAL  2. Pt will demonstrate understanding of scar management to prevent loss of ROM  Goal Status: INITIAL   LONG TERM GOALS: Target date: 12 weeks    Pt will decrease pain at worst from **10 to 3/10 or better to have better sleep and occupational participation in daily roles.  Baseline:  Goal status: INITIAL  2.  Pt will improve grip strength in ** hand from unable to test to at least ** lbs for functional use at home and in IADLs. Baseline: unable to test Goal status: INITIAL  3.  Pt will improve AROM in L/R** thumb MCP and IP to achieve opposition to 5th digit to have functional motion for tasks like reach and grasp.  Baseline:   Goal status: INITIAL  4.  Pt will improve prehension strength from unable to test to WNL for age in order to cut food and open packages. Baseline: unable to test  Goal status: INITIAL  5.  Pt will improve coordination skills in R/L** hand to [facility] on 9 hole peg test to complete fasteners with dressing.  Baseline: unable to test Goal status: INITIAL  6.  **R/L supination improved to within functional limits symptom-free for patient be able to turn doorknob. Baseline: unable to assess 2/2 pain Goal status: INITIAL    ASSESSMENT:  CLINICAL IMPRESSION: Patient seen today for occupational therapy evaluation for L radial fx s/p ORIF on 03/13/24, reports returned to work a week later. Pt arrives with no pain, limited by AROM deficits and weakness. L grip 38#, wrist flexion 50*, extension 30*, forearm pronation 60*, and supination 75*.  Educated on use of heat and stretches, issued HEP for A/AROM exercises and light blue gentle putty exercises. Pt educated on scar massage, condition management and expected progression. Issued Cica care scar pad for night time wear. Pt requesting to visit OT 1x/week with strong emphasis on advancing HEP if pain free. Patient presents with decreased nondominant L grip/pinch strength and wrist/forearm ROM limiting functional use of L hand for ADLs and IADLs.  Patient can benefit from skilled OT services to increase motion and strength to return to prior level of function.    PERFORMANCE DEFICITS: in functional skills including ADLs, IADLs, ROM, strength, pain, flexibility, decreased knowledge of use of DME, and UE functional use,   and psychosocial skills including environmental adaptation and routines and behaviors.   IMPAIRMENTS: are limiting patient from ADLs, IADLs, rest and sleep, play, leisure, and social participation.   COMORBIDITIES: has no other co-morbidities that affects occupational performance. Patient will benefit from skilled OT to address above impairments and improve overall function.  MODIFICATION OR ASSISTANCE TO COMPLETE EVALUATION: No modification of tasks or assist necessary to complete an evaluation.  OT OCCUPATIONAL PROFILE AND HISTORY: Problem focused assessment: Including review of records relating to presenting problem.  CLINICAL DECISION MAKING: LOW - limited treatment options, no task modification necessary  REHAB POTENTIAL: Good for goals  EVALUATION COMPLEXITY: Low  PLAN:  OT FREQUENCY: 1-2x/week  OT DURATION: 12 weeks  PLANNED INTERVENTIONS: 97168 OT Re-evaluation, 97535 self care/ADL training, 02889 therapeutic exercise, 97530 therapeutic activity, 97018 paraffin, 02960 fluidotherapy, 97010 moist heat, 97034 contrast bath, scar mobilization, and passive range of motion  RECOMMENDED OTHER SERVICES: none  CONSULTED AND AGREED WITH PLAN OF CARE:  Patient  PLAN FOR NEXT SESSION: HEP   Elston Slot, M.S. OTR/L  04/29/24, 6:21 PM  ascom [phone]  Elston JINNY Slot, OT 04/29/2024, 6:21 PM   "

## 2024-05-13 ENCOUNTER — Ambulatory Visit: Admitting: Occupational Therapy

## 2024-05-21 ENCOUNTER — Ambulatory Visit: Admitting: Occupational Therapy

## 2024-05-28 ENCOUNTER — Ambulatory Visit: Admitting: Occupational Therapy

## 2024-06-04 ENCOUNTER — Ambulatory Visit: Admitting: Occupational Therapy

## 2024-07-19 ENCOUNTER — Encounter: Admitting: Family

## 2024-07-25 ENCOUNTER — Encounter: Admitting: Family
